# Patient Record
Sex: Male | Born: 1955 | Race: White | Hispanic: No | Marital: Married
Health system: Southern US, Community
[De-identification: ages and names within clinical notes are randomized; demographics above are authoritative.]

## PROBLEM LIST (undated history)

## (undated) ENCOUNTER — Emergency Department (HOSPITAL_COMMUNITY): Admission: EM | Payer: Self-pay

## (undated) DIAGNOSIS — E119 Type 2 diabetes mellitus without complications: Secondary | ICD-10-CM

---

## 2002-12-21 DIAGNOSIS — I219 Acute myocardial infarction, unspecified: Secondary | ICD-10-CM

## 2002-12-21 HISTORY — DX: Acute myocardial infarction, unspecified: I21.9

## 2019-07-03 DIAGNOSIS — E1142 Type 2 diabetes mellitus with diabetic polyneuropathy: Secondary | ICD-10-CM | POA: Diagnosis not present

## 2019-07-03 DIAGNOSIS — E785 Hyperlipidemia, unspecified: Secondary | ICD-10-CM | POA: Diagnosis not present

## 2019-10-05 DIAGNOSIS — G8929 Other chronic pain: Secondary | ICD-10-CM | POA: Diagnosis not present

## 2019-10-05 DIAGNOSIS — E1142 Type 2 diabetes mellitus with diabetic polyneuropathy: Secondary | ICD-10-CM | POA: Diagnosis not present

## 2019-11-20 ENCOUNTER — Other Ambulatory Visit: Payer: Self-pay

## 2019-11-20 NOTE — Patient Outreach (Signed)
[facility]) Care Management  11/20/2019  Terry Howell 1956-10-26 [id number]   Medication Adherence call to Mr. Terry Howell [facility] Identifiers Verify spoke with patient he is past due on Pravastatin 40 mg,patient explain he takes his medication on time,patient wife takes care of his medication,she ask not to call again,patient knows how to order when he needs then.Terry Howell is showing past due under [place].    [facility] Direct Dial [phone]  Fax [phone] [email or web address]

## 2020-01-08 DIAGNOSIS — E1142 Type 2 diabetes mellitus with diabetic polyneuropathy: Secondary | ICD-10-CM | POA: Diagnosis not present

## 2020-05-06 DIAGNOSIS — Z0000 Encounter for general adult medical examination without abnormal findings: Secondary | ICD-10-CM | POA: Diagnosis not present

## 2020-05-06 DIAGNOSIS — E1142 Type 2 diabetes mellitus with diabetic polyneuropathy: Secondary | ICD-10-CM | POA: Diagnosis not present

## 2020-09-06 ENCOUNTER — Inpatient Hospital Stay (HOSPITAL_COMMUNITY)
Admission: RE | Admit: 2020-09-06 | Discharge: 2020-09-20 | DRG: 023 | Disposition: E | Payer: Medicare Other | Source: Other Acute Inpatient Hospital | Attending: Neurology | Admitting: Neurology

## 2020-09-06 ENCOUNTER — Inpatient Hospital Stay (HOSPITAL_COMMUNITY): Payer: Medicare Other | Admitting: Certified Registered Nurse Anesthetist

## 2020-09-06 ENCOUNTER — Encounter (HOSPITAL_COMMUNITY): Payer: Self-pay | Admitting: Interventional Radiology

## 2020-09-06 ENCOUNTER — Inpatient Hospital Stay (HOSPITAL_COMMUNITY): Payer: Medicare Other

## 2020-09-06 ENCOUNTER — Encounter (HOSPITAL_COMMUNITY): Admission: AD | Disposition: E | Payer: Self-pay | Source: Other Acute Inpatient Hospital | Attending: Neurology

## 2020-09-06 DIAGNOSIS — Z0189 Encounter for other specified special examinations: Secondary | ICD-10-CM

## 2020-09-06 DIAGNOSIS — J9601 Acute respiratory failure with hypoxia: Secondary | ICD-10-CM | POA: Diagnosis present

## 2020-09-06 DIAGNOSIS — I429 Cardiomyopathy, unspecified: Secondary | ICD-10-CM | POA: Diagnosis present

## 2020-09-06 DIAGNOSIS — E1165 Type 2 diabetes mellitus with hyperglycemia: Secondary | ICD-10-CM | POA: Diagnosis present

## 2020-09-06 DIAGNOSIS — I5023 Acute on chronic systolic (congestive) heart failure: Secondary | ICD-10-CM | POA: Diagnosis present

## 2020-09-06 DIAGNOSIS — D72829 Elevated white blood cell count, unspecified: Secondary | ICD-10-CM | POA: Diagnosis present

## 2020-09-06 DIAGNOSIS — R29724 NIHSS score 24: Secondary | ICD-10-CM | POA: Diagnosis present

## 2020-09-06 DIAGNOSIS — I952 Hypotension due to drugs: Secondary | ICD-10-CM | POA: Diagnosis not present

## 2020-09-06 DIAGNOSIS — R0902 Hypoxemia: Secondary | ICD-10-CM

## 2020-09-06 DIAGNOSIS — J9600 Acute respiratory failure, unspecified whether with hypoxia or hypercapnia: Secondary | ICD-10-CM

## 2020-09-06 DIAGNOSIS — G8191 Hemiplegia, unspecified affecting right dominant side: Secondary | ICD-10-CM | POA: Diagnosis not present

## 2020-09-06 DIAGNOSIS — I255 Ischemic cardiomyopathy: Secondary | ICD-10-CM | POA: Diagnosis not present

## 2020-09-06 DIAGNOSIS — R29810 Facial weakness: Secondary | ICD-10-CM | POA: Diagnosis present

## 2020-09-06 DIAGNOSIS — D62 Acute posthemorrhagic anemia: Secondary | ICD-10-CM | POA: Diagnosis not present

## 2020-09-06 DIAGNOSIS — I2510 Atherosclerotic heart disease of native coronary artery without angina pectoris: Secondary | ICD-10-CM | POA: Diagnosis present

## 2020-09-06 DIAGNOSIS — G9389 Other specified disorders of brain: Secondary | ICD-10-CM | POA: Diagnosis present

## 2020-09-06 DIAGNOSIS — Z955 Presence of coronary angioplasty implant and graft: Secondary | ICD-10-CM

## 2020-09-06 DIAGNOSIS — E785 Hyperlipidemia, unspecified: Secondary | ICD-10-CM | POA: Diagnosis present

## 2020-09-06 DIAGNOSIS — Z7401 Bed confinement status: Secondary | ICD-10-CM

## 2020-09-06 DIAGNOSIS — IMO0002 Reserved for concepts with insufficient information to code with codable children: Secondary | ICD-10-CM | POA: Diagnosis present

## 2020-09-06 DIAGNOSIS — I63412 Cerebral infarction due to embolism of left middle cerebral artery: Secondary | ICD-10-CM | POA: Diagnosis present

## 2020-09-06 DIAGNOSIS — Z7189 Other specified counseling: Secondary | ICD-10-CM | POA: Diagnosis not present

## 2020-09-06 DIAGNOSIS — H53461 Homonymous bilateral field defects, right side: Secondary | ICD-10-CM | POA: Diagnosis present

## 2020-09-06 DIAGNOSIS — B999 Unspecified infectious disease: Secondary | ICD-10-CM

## 2020-09-06 DIAGNOSIS — Z515 Encounter for palliative care: Secondary | ICD-10-CM | POA: Diagnosis not present

## 2020-09-06 DIAGNOSIS — I358 Other nonrheumatic aortic valve disorders: Secondary | ICD-10-CM | POA: Diagnosis present

## 2020-09-06 DIAGNOSIS — I69391 Dysphagia following cerebral infarction: Secondary | ICD-10-CM

## 2020-09-06 DIAGNOSIS — R4701 Aphasia: Secondary | ICD-10-CM | POA: Diagnosis present

## 2020-09-06 DIAGNOSIS — I639 Cerebral infarction, unspecified: Secondary | ICD-10-CM

## 2020-09-06 DIAGNOSIS — J810 Acute pulmonary edema: Secondary | ICD-10-CM | POA: Diagnosis present

## 2020-09-06 DIAGNOSIS — I110 Hypertensive heart disease with heart failure: Secondary | ICD-10-CM | POA: Diagnosis present

## 2020-09-06 DIAGNOSIS — E876 Hypokalemia: Secondary | ICD-10-CM | POA: Diagnosis not present

## 2020-09-06 DIAGNOSIS — R509 Fever, unspecified: Secondary | ICD-10-CM | POA: Diagnosis present

## 2020-09-06 DIAGNOSIS — Z66 Do not resuscitate: Secondary | ICD-10-CM | POA: Diagnosis not present

## 2020-09-06 DIAGNOSIS — Z794 Long term (current) use of insulin: Secondary | ICD-10-CM | POA: Diagnosis not present

## 2020-09-06 DIAGNOSIS — I959 Hypotension, unspecified: Secondary | ICD-10-CM | POA: Diagnosis present

## 2020-09-06 DIAGNOSIS — I63512 Cerebral infarction due to unspecified occlusion or stenosis of left middle cerebral artery: Secondary | ICD-10-CM | POA: Diagnosis present

## 2020-09-06 DIAGNOSIS — I340 Nonrheumatic mitral (valve) insufficiency: Secondary | ICD-10-CM | POA: Diagnosis not present

## 2020-09-06 DIAGNOSIS — G9340 Encephalopathy, unspecified: Secondary | ICD-10-CM | POA: Diagnosis present

## 2020-09-06 DIAGNOSIS — R627 Adult failure to thrive: Secondary | ICD-10-CM | POA: Diagnosis present

## 2020-09-06 DIAGNOSIS — E7800 Pure hypercholesterolemia, unspecified: Secondary | ICD-10-CM | POA: Diagnosis not present

## 2020-09-06 DIAGNOSIS — I10 Essential (primary) hypertension: Secondary | ICD-10-CM | POA: Diagnosis present

## 2020-09-06 DIAGNOSIS — E870 Hyperosmolality and hypernatremia: Secondary | ICD-10-CM | POA: Diagnosis present

## 2020-09-06 DIAGNOSIS — I252 Old myocardial infarction: Secondary | ICD-10-CM

## 2020-09-06 DIAGNOSIS — R7881 Bacteremia: Secondary | ICD-10-CM | POA: Diagnosis not present

## 2020-09-06 DIAGNOSIS — R1310 Dysphagia, unspecified: Secondary | ICD-10-CM | POA: Diagnosis present

## 2020-09-06 DIAGNOSIS — I5043 Acute on chronic combined systolic (congestive) and diastolic (congestive) heart failure: Secondary | ICD-10-CM | POA: Diagnosis not present

## 2020-09-06 DIAGNOSIS — I509 Heart failure, unspecified: Secondary | ICD-10-CM

## 2020-09-06 DIAGNOSIS — I5021 Acute systolic (congestive) heart failure: Secondary | ICD-10-CM | POA: Diagnosis present

## 2020-09-06 DIAGNOSIS — R1312 Dysphagia, oropharyngeal phase: Secondary | ICD-10-CM | POA: Diagnosis not present

## 2020-09-06 DIAGNOSIS — I471 Supraventricular tachycardia: Secondary | ICD-10-CM | POA: Diagnosis not present

## 2020-09-06 DIAGNOSIS — I25119 Atherosclerotic heart disease of native coronary artery with unspecified angina pectoris: Secondary | ICD-10-CM | POA: Diagnosis not present

## 2020-09-06 DIAGNOSIS — Z20822 Contact with and (suspected) exposure to covid-19: Secondary | ICD-10-CM | POA: Diagnosis present

## 2020-09-06 DIAGNOSIS — E1159 Type 2 diabetes mellitus with other circulatory complications: Secondary | ICD-10-CM | POA: Diagnosis not present

## 2020-09-06 HISTORY — DX: Type 2 diabetes mellitus without complications: E11.9

## 2020-09-06 HISTORY — PX: IR CT HEAD LTD: IMG2386

## 2020-09-06 HISTORY — PX: RADIOLOGY WITH ANESTHESIA: SHX6223

## 2020-09-06 HISTORY — PX: IR PERCUTANEOUS ART THROMBECTOMY/INFUSION INTRACRANIAL INC DIAG ANGIO: IMG6087

## 2020-09-06 HISTORY — PX: IR US GUIDE VASC ACCESS RIGHT: IMG2390

## 2020-09-06 LAB — GLUCOSE, CAPILLARY: Glucose-Capillary: 141 mg/dL — ABNORMAL HIGH (ref 70–99)

## 2020-09-06 SURGERY — IR WITH ANESTHESIA
Anesthesia: General

## 2020-09-06 MED ORDER — ASPIRIN 81 MG PO CHEW
CHEWABLE_TABLET | ORAL | Status: AC
  Filled 2020-09-06: qty 1

## 2020-09-06 MED ORDER — LIDOCAINE HCL (CARDIAC) PF 100 MG/5ML IV SOSY
PREFILLED_SYRINGE | INTRAVENOUS | Status: DC | PRN
  Administered 2020-09-06: 40 mg via INTRAVENOUS

## 2020-09-06 MED ORDER — PROPOFOL 1000 MG/100ML IV EMUL
5.0000 ug/kg/min | INTRAVENOUS | Status: DC
  Administered 2020-09-07: 40 ug/kg/min via INTRAVENOUS
  Administered 2020-09-07: 25 ug/kg/min via INTRAVENOUS
  Administered 2020-09-07 (×2): 50 ug/kg/min via INTRAVENOUS
  Administered 2020-09-07: 40 ug/kg/min via INTRAVENOUS
  Administered 2020-09-08: 20 ug/kg/min via INTRAVENOUS
  Administered 2020-09-08: 40 ug/kg/min via INTRAVENOUS
  Administered 2020-09-08: 30 ug/kg/min via INTRAVENOUS
  Administered 2020-09-08: 40 ug/kg/min via INTRAVENOUS
  Administered 2020-09-08: 30 ug/kg/min via INTRAVENOUS
  Administered 2020-09-09: 10 ug/kg/min via INTRAVENOUS
  Administered 2020-09-09 (×2): 30 ug/kg/min via INTRAVENOUS
  Administered 2020-09-09: 20 ug/kg/min via INTRAVENOUS
  Administered 2020-09-10 (×2): 30 ug/kg/min via INTRAVENOUS
  Filled 2020-09-06 (×11): qty 100
  Filled 2020-09-06: qty 200
  Filled 2020-09-06 (×6): qty 100

## 2020-09-06 MED ORDER — STROKE: EARLY STAGES OF RECOVERY BOOK
Freq: Once | Status: DC
  Filled 2020-09-06 (×2): qty 1

## 2020-09-06 MED ORDER — TICAGRELOR 90 MG PO TABS
ORAL_TABLET | ORAL | Status: AC
  Filled 2020-09-06: qty 2

## 2020-09-06 MED ORDER — EPHEDRINE SULFATE 50 MG/ML IJ SOLN
INTRAMUSCULAR | Status: DC | PRN
  Administered 2020-09-06: 20 mg via INTRAVENOUS
  Administered 2020-09-06 (×2): 15 mg via INTRAVENOUS

## 2020-09-06 MED ORDER — ACETAMINOPHEN 160 MG/5ML PO SOLN
650.0000 mg | ORAL | Status: DC | PRN
  Administered 2020-09-08 – 2020-09-10 (×8): 650 mg
  Filled 2020-09-06 (×9): qty 20.3

## 2020-09-06 MED ORDER — ROCURONIUM BROMIDE 100 MG/10ML IV SOLN
INTRAVENOUS | Status: DC | PRN
  Administered 2020-09-06: 80 mg via INTRAVENOUS

## 2020-09-06 MED ORDER — PROPOFOL 1000 MG/100ML IV EMUL: 5.0000 ug/kg/min | INTRAVENOUS | Status: DC

## 2020-09-06 MED ORDER — CLOPIDOGREL BISULFATE 300 MG PO TABS
ORAL_TABLET | ORAL | Status: AC
  Filled 2020-09-06: qty 1

## 2020-09-06 MED ORDER — IOHEXOL 300 MG/ML  SOLN
50.0000 mL | Freq: Once | INTRAMUSCULAR | Status: AC | PRN
  Administered 2020-09-06: 20 mL via INTRA_ARTERIAL

## 2020-09-06 MED ORDER — SUCCINYLCHOLINE CHLORIDE 20 MG/ML IJ SOLN
INTRAMUSCULAR | Status: DC | PRN
  Administered 2020-09-06: 180 mg via INTRAVENOUS

## 2020-09-06 MED ORDER — VERAPAMIL HCL 2.5 MG/ML IV SOLN
INTRAVENOUS | Status: AC
  Filled 2020-09-06: qty 2

## 2020-09-06 MED ORDER — NITROGLYCERIN 1 MG/10 ML FOR IR/CATH LAB
INTRA_ARTERIAL | Status: AC
  Filled 2020-09-06: qty 10

## 2020-09-06 MED ORDER — PHENYLEPHRINE 40 MCG/ML (10ML) SYRINGE FOR IV PUSH (FOR BLOOD PRESSURE SUPPORT)
PREFILLED_SYRINGE | INTRAVENOUS | Status: DC | PRN
  Administered 2020-09-06: 120 ug via INTRAVENOUS
  Administered 2020-09-06: 160 ug via INTRAVENOUS
  Administered 2020-09-06: 120 ug via INTRAVENOUS

## 2020-09-06 MED ORDER — PANTOPRAZOLE SODIUM 40 MG IV SOLR
40.0000 mg | Freq: Every day | INTRAVENOUS | Status: DC
  Administered 2020-09-07 – 2020-09-10 (×4): 40 mg via INTRAVENOUS
  Filled 2020-09-06 (×4): qty 40

## 2020-09-06 MED ORDER — SODIUM CHLORIDE 0.9 % IV SOLN: INTRAVENOUS | Status: DC

## 2020-09-06 MED ORDER — TIROFIBAN HCL IN NACL 5-0.9 MG/100ML-% IV SOLN
INTRAVENOUS | Status: AC
  Filled 2020-09-06: qty 100

## 2020-09-06 MED ORDER — LACTATED RINGERS IV SOLN: INTRAVENOUS | Status: DC | PRN

## 2020-09-06 MED ORDER — SENNOSIDES-DOCUSATE SODIUM 8.6-50 MG PO TABS
1.0000 | ORAL_TABLET | Freq: Every evening | ORAL | Status: DC | PRN
  Filled 2020-09-06: qty 1

## 2020-09-06 MED ORDER — IOHEXOL 240 MG/ML SOLN
INTRAMUSCULAR | Status: AC
  Filled 2020-09-06: qty 200

## 2020-09-06 MED ORDER — PHENYLEPHRINE HCL-NACL 10-0.9 MG/250ML-% IV SOLN
INTRAVENOUS | Status: DC | PRN
  Administered 2020-09-06: 20 ug/min via INTRAVENOUS

## 2020-09-06 MED ORDER — ACETAMINOPHEN 325 MG PO TABS
650.0000 mg | ORAL_TABLET | ORAL | Status: DC | PRN
  Filled 2020-09-06 (×2): qty 2

## 2020-09-06 MED ORDER — PROPOFOL 10 MG/ML IV BOLUS
INTRAVENOUS | Status: DC | PRN
  Administered 2020-09-06: 110 mg via INTRAVENOUS

## 2020-09-06 MED ORDER — EPTIFIBATIDE 20 MG/10ML IV SOLN
INTRAVENOUS | Status: AC
  Filled 2020-09-06: qty 10

## 2020-09-06 MED ORDER — PHENYLEPHRINE HCL-NACL 10-0.9 MG/250ML-% IV SOLN
0.0000 ug/min | INTRAVENOUS | Status: DC
  Administered 2020-09-07: 5 ug/min via INTRAVENOUS
  Filled 2020-09-06 (×2): qty 250

## 2020-09-06 MED ORDER — IOHEXOL 300 MG/ML  SOLN
150.0000 mL | Freq: Once | INTRAMUSCULAR | Status: AC | PRN
  Administered 2020-09-06: 25 mL via INTRA_ARTERIAL

## 2020-09-06 MED ORDER — ACETAMINOPHEN 650 MG RE SUPP
650.0000 mg | RECTAL | Status: DC | PRN
  Administered 2020-09-07 – 2020-09-11 (×5): 650 mg via RECTAL
  Filled 2020-09-06 (×4): qty 1

## 2020-09-06 NOTE — Anesthesia Preprocedure Evaluation (Signed)
Anesthesia Evaluation  Patient identified by MRN, date of birth, ID band Patient unresponsive    Reviewed: Allergy & Precautions, Unable to perform ROS - Chart review onlyPreop documentation limited or incomplete due to emergent nature of procedure.  Airway Mallampati: III  TM Distance: >3 FB     Dental  (+) Edentulous Upper, Partial Lower   Pulmonary     + decreased breath sounds      Cardiovascular hypertension,  Rhythm:Regular Rate:Normal     Neuro/Psych CVA    GI/Hepatic   Endo/Other  diabetes  Renal/GU      Musculoskeletal   Abdominal (+) + obese,   Peds  (+) Neurological problem Hematology   Anesthesia Other Findings   Reproductive/Obstetrics                             Anesthesia Physical Anesthesia Plan  ASA: IV and emergent  Anesthesia Plan: General   Post-op Pain Management:    Induction: Intravenous  PONV Risk Score and Plan:   Airway Management Planned: Oral ETT  Additional Equipment: Arterial line  Intra-op Plan:   Post-operative Plan: Post-operative intubation/ventilation  Informed Consent: I have reviewed the patients History and Physical, chart, labs and discussed the procedure including the risks, benefits and alternatives for the proposed anesthesia with the patient or authorized representative who has indicated his/her understanding and acceptance.       Plan Discussed with: CRNA and Anesthesiologist  Anesthesia Plan Comments:         Anesthesia Quick Evaluation

## 2020-09-06 NOTE — Anesthesia Procedure Notes (Signed)
Arterial Line Insertion Start/End09/01/2020 9:48 AM, 09/06/2020 9:53 PM Performed by: Kipp Brood, MD, anesthesiologist  Emergency situation radial was placed Catheter size: 20 G Hand hygiene performed  and maximum sterile barriers used  Allen's test indicative of satisfactory collateral circulation Attempts: 1 Procedure performed using ultrasound guided technique. Following insertion, Biopatch and dressing applied. Post procedure assessment: normal  Patient tolerated the procedure well with no immediate complications.

## 2020-09-06 NOTE — Progress Notes (Signed)
NeuroInterventional Radiology  Pre-Procedure Note  History: 64 yo male witnessed (heard) by his wife to fall at about 4pm this afternoon.  He was worked up at [facility] for stroke, and discovered to have a left M1 occlusion. History is positive for CV risk factors to include known MI/CAD, diabetes, pulmonary disease.   He was given tPA and transferred to [facility].   Baseline mRS: 0 NIHSS:   24  CT ASPECTS: 10 CTA:   Left M1 occlusion   I have discussed the case with Dr. Laurence Slate of Stroke Neurology.  Given the patient's symptoms, imaging findings, baseline function, we agree they are an appropriate candidate for attempt for mechanical thrombectomy.    The risks and benefits of the procedure were discussed with the patient's wife,Terry Howell, with specific risks including: bleeding, infection, arterial injury/dissection, contrast reaction, kidney injury, need for further procedure/surgery, neurologic deficit, 10-15% risk of intracranial hemorrhage, cardiopulmonary collapse, death. All questions were answered.  The wife give consent and would like to proceed with attempt at thrombectomy.   Plan for cerebral angiogram and attempt at mechanical thrombectomy.   Signed,   Yvone Neu. Loreta Ave, DO

## 2020-09-06 NOTE — H&P (Signed)
Chief Complaint: Aphasia right-sided weakness  History obtained from: Patient and Chart HPI:                                                                                                                                       Rollin Kotowski. is a 64 y.o. male with past medical history significant for hypertension, hyperlipidemia, uncontrolled insulin-dependent diabetes mellitus, history of coronary artery disease status post PCI, heart failure presents to the emergency department [facility] with sudden onset aphasia and right-sided weakness that began at 4 PM.  Last seen normal was 4 PM-patient was out to take the trash and suddenly developed right-sided weakness and aphasia.  Patient was taken to [facility] ED as a code stroke.  Tele-neurologist was consulted and upon assessment NIH stroke scale was 24.  Stat CT head was performed which showed no hemorrhage and aspects was 10/10.  Patient received IV TPA with delayed due to wife's initial reluctance.  CT angiogram was performed which showed a left M1 occlusion.  Received a call from Dr. Thomasena Edis, ED PA [facility] around 8/15 PM.  Spoke to her over the phone and patient appeared to be a candidate for mechanical thrombectomy and requests immediate transfer to [facility] for emergent mechanical thrombectomy. Discussed with patient's wife over the phone who consented for thrombectomy procedure. Patient arrived around 9:30 PM to [facility] emergency room-was immediately taken for neuro intervention.  NIH stroke scale remained stable 24 prior to thrombectomy.  IV TPA was completed at 9:06 PM.  Blood pressure was 112 systolic    Date last known well: 09-18-20 Time last known well: 4 PM tPA Given: Yes NIHSS: 24 Baseline MRS 0  Past medical history -As stated above  History reviewed. No pertinent family history. Social History:  has no history on file for tobacco use, alcohol use, and drug use.  Allergies: Not on  File  Medications:                                                                                                                        I reviewed home medications   ROS:  Unable to review due to patient's mental status   Examination:                                                                                                      General: Appears well-developed . Psych: Affect appropriate to situation Eyes: No scleral injection HENT: No OP obstrucion Head: Normocephalic.  Cardiovascular: Normal rate and regular rhythm.  Respiratory: Effort normal and breath sounds normal to anterior ascultation GI: Soft.  No distension. There is no tenderness.  Skin: WDI    Neurological Examination Mental Status: Awake, nonverbal and not following commands consistently Cranial Nerves: II: Visual fields: Right homonymous hemianopsia  III,IV, VI: ptosis not present, left forced gaze deviation, pupils equal, round, reactive to light and accommodation V,VII: Right facial droop, unable to assess facial sensation XII: midline tongue extension Motor: Right : Upper extremity   0/5    Left:     Upper extremity   5/5  Lower extremity   0//5     Lower extremity   5/5 Tone and bulk:normal tone throughout; no atrophy noted Sensory: Neglect and does not withdraw to noxious stimulus on the right side Deep Tendon Reflexes: 2+ and symmetric throughout Plantars: Right: downgoing   Left: downgoing Cerebellar: No gross ataxia noted on the left side      Lab Results: Basic Metabolic Panel: No results for input(s): NA, K, CL, CO2, GLUCOSE, BUN, CREATININE, CALCIUM, MG, PHOS in the last 168 hours.  CBC: No results for input(s): WBC, NEUTROABS, HGB, HCT, MCV, PLT in the last 168 hours.  Coagulation Studies: No results for input(s): LABPROT, INR in the last 72  hours.  Imaging: No results found.   ASSESSMENT AND PLAN  64 y.o. 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  Patient received IV TPA and is a candidate for mechanical thrombectomy to presented within 6 hours last known normal.  Spoke to wife in detail regarding benefits versus risk of mechanical thrombectomy including 30 to 50% chance of good functional recovery versus 10% risk of major intracranial hemorrhage.  Without intervention patient will likely most likely be severely disabled and bedbound if he survives.  Patient also has heart failure and uncontrolled diabetes-which increases chance of early progression and completion of stroke.  Patient's wife consented to the procedure understanding these risks.  Acute left MCA stroke status post IV TPA and emergent mechanical thrombectomy with TICI 3 recanalization   #Admit to neuro ICU # MRI of the brain without contrast #Echocardiogram #No antiplatelets until 24 hours after repeat CT head negative for hemorrhage #Start or continue Atorvastatin 80 mg/other high intensity statin # BP goal: 1 20-1 40 systolic # HBAIC and Lipid profile # Telemetry monitoring # Frequent neuro checks #  stroke swallow screen  #Hypertension -BP goal 1 20-140 systolic, as needed Cleviprex available  #Acute respiratory failure  Appreciate PCCM assistance  #History of congestive heart failure -Management by PCCM -Avoid excessive fluids  #Insulin-dependent diabetes mellitus -Sliding scale insulin -A1c ordered    This patient is neurologically critically ill due to stroke requiring TPA and thrombectomy he is at risk for significant risk of  neurological worsening from cerebral edema,  death from brain herniation, heart failure, hemorrhagic conversion, infection, respiratory failure and seizure. This patient's care requires constant monitoring of vital signs, hemodynamics, respiratory and cardiac monitoring, review of multiple databases, neurological assessment, discussion with family, other specialists and medical decision making of high complexity.  I spent 65 minutes of neurocritical time in the care of this patient.     Please page stroke NP  Or  PA  Or MD from 8am -4 pm  as this patient from this time will be  followed by the stroke.   You can look them up on [email or web address]  Password [facility]    Hy Swiatek [facility] Pager Number [phone]

## 2020-09-06 NOTE — Anesthesia Procedure Notes (Addendum)
Procedure Name: Intubation Date/Time: 2020/09/10 9:42 PM Performed by: Drema Pry, CRNA Pre-anesthesia Checklist: Patient identified, Emergency Drugs available, Suction available and Patient being monitored Patient Re-evaluated:Patient Re-evaluated prior to induction Oxygen Delivery Method: Circle System Utilized Preoxygenation: Pre-oxygenation with 100% oxygen Induction Type: IV induction, Rapid sequence and Cricoid Pressure applied Laryngoscope Size: Glidescope and 4 Grade View: Grade I Tube type: Oral Tube size: 7.5 mm Number of attempts: 1 Airway Equipment and Method: Stylet Placement Confirmation: ETT inserted through vocal cords under direct vision,  positive ETCO2 and breath sounds checked- equal and bilateral Secured at: 22 cm Tube secured with: Tape Dental Injury: Teeth and Oropharynx as per pre-operative assessment

## 2020-09-06 NOTE — Code Documentation (Signed)
Responded to Code IR paged out at 2033 for L M1 occlusion, aphasia, R sided deficits. Pt received TPA at [facility]. Pt arrived at 2125 and transported to [employer] [facility] at 2128. Per [facility], TPA finished at 2106. [facility] then began TPA flush. Pt intubated by anesthesia and transported to IR suite.

## 2020-09-06 NOTE — Transfer of Care (Signed)
Immediate Anesthesia Transfer of Care Note  Patient: Terry Howell.  Procedure(s) Performed: IR WITH ANESTHESIA (N/A )  Patient Location: PACU  Anesthesia Type:General  Level of Consciousness: sedated and Patient remains intubated per anesthesia plan  Airway & Oxygen Therapy: Patient remains intubated per anesthesia plan and Patient placed on Ventilator (see vital sign flow sheet for setting)  Post-op Assessment: Report given to RN and Post -op Vital signs reviewed and stable  Post vital signs: Reviewed and stable  Last Vitals:  Vitals Value Taken Time  BP 116/77 21-Sep-2020 2310  Temp    Pulse 93 Sep 21, 2020 2319  Resp 14 Sep 21, 2020 2319  SpO2 100 % 2020-09-21 2319  Vitals shown include unvalidated device data.  Last Pain: There were no vitals filed for this visit.       Complications: No complications documented.

## 2020-09-06 NOTE — Procedures (Signed)
Neuro-Interventional Radiology  Post Cerebral Angiogram Procedure Note  Operator:    Dr. Loreta Ave Assistant:   None  History: 64 yo male witnessed (heard) by his wife to fall at about 4pm this afternoon.  He was worked up at [facility] for stroke, and discovered to have a left M1 occlusion. History is positive for CV risk factors to include known MI/CAD, diabetes, pulmonary disease.  Baseline mRS:  0 NIHSS:   24 Site of occlusion:  Left m1   Procedure: US guided right CFA access Cervical & Cerebral Angiogram Mechanical Thrombectomy Deployment of Angioseal Flat Panel CT in NIR  Baseline: TICI 0  First Pass Device:  Direct aspiration, zoom 71  Result   TICI 3  Flat panel findings:   No hemorrhage  Anesthesia:   GETA  EBL:    60cc     Complication:  None   Medication: IV tPA administered?: yes IA Medication:  no  Recommendations: - right hip straight overnight - Goal SBP 120-140.   - Frequent NV checks - Repeat CT or MRI imaging recommended within 36 hours, discretion of Neurology - NIR to follow    Signed,  Yvone Neu. Loreta Ave, DO

## 2020-09-07 ENCOUNTER — Inpatient Hospital Stay (HOSPITAL_COMMUNITY): Payer: Medicare Other

## 2020-09-07 ENCOUNTER — Encounter (HOSPITAL_COMMUNITY): Payer: Self-pay | Admitting: Neurology

## 2020-09-07 ENCOUNTER — Other Ambulatory Visit: Payer: Self-pay

## 2020-09-07 DIAGNOSIS — I639 Cerebral infarction, unspecified: Secondary | ICD-10-CM

## 2020-09-07 DIAGNOSIS — E7800 Pure hypercholesterolemia, unspecified: Secondary | ICD-10-CM

## 2020-09-07 DIAGNOSIS — I952 Hypotension due to drugs: Secondary | ICD-10-CM

## 2020-09-07 DIAGNOSIS — I63512 Cerebral infarction due to unspecified occlusion or stenosis of left middle cerebral artery: Secondary | ICD-10-CM

## 2020-09-07 DIAGNOSIS — J9601 Acute respiratory failure with hypoxia: Secondary | ICD-10-CM

## 2020-09-07 DIAGNOSIS — D72829 Elevated white blood cell count, unspecified: Secondary | ICD-10-CM

## 2020-09-07 DIAGNOSIS — J9600 Acute respiratory failure, unspecified whether with hypoxia or hypercapnia: Secondary | ICD-10-CM

## 2020-09-07 DIAGNOSIS — I255 Ischemic cardiomyopathy: Secondary | ICD-10-CM

## 2020-09-07 DIAGNOSIS — I340 Nonrheumatic mitral (valve) insufficiency: Secondary | ICD-10-CM

## 2020-09-07 LAB — LIPID PANEL
Cholesterol: 97 mg/dL (ref 0–200)
HDL: 30 mg/dL — ABNORMAL LOW (ref >40)
LDL Cholesterol: 50 mg/dL (ref 0–99)
Total CHOL/HDL Ratio: 3.2 RATIO
Triglycerides: 86 mg/dL (ref <150)
VLDL: 17 mg/dL (ref 0–40)

## 2020-09-07 LAB — MRSA PCR SCREENING: MRSA by PCR: NEGATIVE

## 2020-09-07 LAB — BASIC METABOLIC PANEL
Anion gap: 12 (ref 5–15)
BUN: 34 mg/dL — ABNORMAL HIGH (ref 8–23)
CO2: 22 mmol/L (ref 22–32)
Calcium: 8.4 mg/dL — ABNORMAL LOW (ref 8.9–10.3)
Chloride: 107 mmol/L (ref 98–111)
Creatinine, Ser: 1.11 mg/dL (ref 0.61–1.24)
GFR calc Af Amer: >60 mL/min (ref >60)
GFR calc non Af Amer: >60 mL/min (ref >60)
Glucose, Bld: 162 mg/dL — ABNORMAL HIGH (ref 70–99)
Potassium: 3.7 mmol/L (ref 3.5–5.1)
Sodium: 141 mmol/L (ref 135–145)

## 2020-09-07 LAB — POCT I-STAT 7, (LYTES, BLD GAS, ICA,H+H)
Acid-base deficit: 1 mmol/L (ref 0.0–2.0)
Bicarbonate: 23.5 mmol/L (ref 20.0–28.0)
Calcium, Ion: 1.13 mmol/L — ABNORMAL LOW (ref 1.15–1.40)
HCT: 30 % — ABNORMAL LOW (ref 39.0–52.0)
Hemoglobin: 10.2 g/dL — ABNORMAL LOW (ref 13.0–17.0)
O2 Saturation: 100 %
Patient temperature: 98.4
Potassium: 3.7 mmol/L (ref 3.5–5.1)
Sodium: 142 mmol/L (ref 135–145)
TCO2: 25 mmol/L (ref 22–32)
pCO2 arterial: 35.5 mmHg (ref 32.0–48.0)
pH, Arterial: 7.428 (ref 7.350–7.450)
pO2, Arterial: 306 mmHg — ABNORMAL HIGH (ref 83.0–108.0)

## 2020-09-07 LAB — ECHOCARDIOGRAM COMPLETE
Area-P 1/2: 5.97 cm2
MV M vel: 3.46 m/s
MV Peak grad: 47.9 mmHg
S' Lateral: 4.2 cm
Weight: 4409.2 oz

## 2020-09-07 LAB — GLUCOSE, CAPILLARY
Glucose-Capillary: 157 mg/dL — ABNORMAL HIGH (ref 70–99)
Glucose-Capillary: 153 mg/dL — ABNORMAL HIGH (ref 70–99)
Glucose-Capillary: 190 mg/dL — ABNORMAL HIGH (ref 70–99)
Glucose-Capillary: 174 mg/dL — ABNORMAL HIGH (ref 70–99)
Glucose-Capillary: 161 mg/dL — ABNORMAL HIGH (ref 70–99)
Glucose-Capillary: 196 mg/dL — ABNORMAL HIGH (ref 70–99)

## 2020-09-07 LAB — CBC
HCT: 33.5 % — ABNORMAL LOW (ref 39.0–52.0)
Hemoglobin: 10.5 g/dL — ABNORMAL LOW (ref 13.0–17.0)
MCH: 31.6 pg (ref 26.0–34.0)
MCHC: 31.3 g/dL (ref 30.0–36.0)
MCV: 100.9 fL — ABNORMAL HIGH (ref 80.0–100.0)
Platelets: 197 10*3/uL (ref 150–400)
RBC: 3.32 MIL/uL — ABNORMAL LOW (ref 4.22–5.81)
RDW: 14.7 % (ref 11.5–15.5)
WBC: 19.4 10*3/uL — ABNORMAL HIGH (ref 4.0–10.5)
nRBC: 0 % (ref 0.0–0.2)

## 2020-09-07 LAB — RAPID URINE DRUG SCREEN, HOSP PERFORMED
Amphetamines: NOT DETECTED
Barbiturates: NOT DETECTED
Benzodiazepines: NOT DETECTED
Cocaine: NOT DETECTED
Opiates: NOT DETECTED
Tetrahydrocannabinol: NOT DETECTED

## 2020-09-07 LAB — HIV ANTIBODY (ROUTINE TESTING W REFLEX): HIV Screen 4th Generation wRfx: NONREACTIVE

## 2020-09-07 LAB — HEMOGLOBIN A1C
Hgb A1c MFr Bld: 8.7 % — ABNORMAL HIGH (ref 4.8–5.6)
Mean Plasma Glucose: 202.99 mg/dL

## 2020-09-07 LAB — PHOSPHORUS: Phosphorus: 3.6 mg/dL (ref 2.5–4.6)

## 2020-09-07 LAB — MAGNESIUM: Magnesium: 1.9 mg/dL (ref 1.7–2.4)

## 2020-09-07 MED ORDER — FUROSEMIDE 10 MG/ML IJ SOLN
INTRAMUSCULAR | Status: AC
  Filled 2020-09-07: qty 4

## 2020-09-07 MED ORDER — FUROSEMIDE 10 MG/ML IJ SOLN
20.0000 mg | Freq: Two times a day (BID) | INTRAMUSCULAR | Status: DC
  Administered 2020-09-07 – 2020-09-08 (×3): 20 mg via INTRAVENOUS
  Filled 2020-09-07 (×3): qty 2

## 2020-09-07 MED ORDER — PNEUMOCOCCAL VAC POLYVALENT 25 MCG/0.5ML IJ INJ: 0.5000 mL | INJECTION | INTRAMUSCULAR | Status: DC

## 2020-09-07 MED ORDER — INSULIN ASPART 100 UNIT/ML ~~LOC~~ SOLN
0.0000 [IU] | SUBCUTANEOUS | Status: DC
  Administered 2020-09-07 (×4): 3 [IU] via SUBCUTANEOUS
  Administered 2020-09-08: 5 [IU] via SUBCUTANEOUS
  Administered 2020-09-08: 8 [IU] via SUBCUTANEOUS
  Administered 2020-09-08: 5 [IU] via SUBCUTANEOUS
  Administered 2020-09-08: 11 [IU] via SUBCUTANEOUS
  Administered 2020-09-08: 3 [IU] via SUBCUTANEOUS
  Administered 2020-09-09 (×3): 15 [IU] via SUBCUTANEOUS
  Administered 2020-09-09: 11 [IU] via SUBCUTANEOUS

## 2020-09-07 MED ORDER — INFLUENZA VAC SPLIT QUAD 0.5 ML IM SUSY: 0.5000 mL | PREFILLED_SYRINGE | INTRAMUSCULAR | Status: DC

## 2020-09-07 MED ORDER — PERFLUTREN LIPID MICROSPHERE
1.0000 mL | INTRAVENOUS | Status: AC | PRN
  Filled 2020-09-07: qty 10

## 2020-09-07 MED ORDER — PERFLUTREN LIPID MICROSPHERE
INTRAVENOUS | Status: AC
  Administered 2020-09-07: 1 mL
  Filled 2020-09-07: qty 10

## 2020-09-07 MED ORDER — CHLORHEXIDINE GLUCONATE 0.12% ORAL RINSE (MEDLINE KIT)
15.0000 mL | Freq: Two times a day (BID) | OROMUCOSAL | Status: DC
  Administered 2020-09-07 – 2020-09-10 (×7): 15 mL via OROMUCOSAL

## 2020-09-07 MED ORDER — CHLORHEXIDINE GLUCONATE CLOTH 2 % EX PADS
6.0000 | MEDICATED_PAD | Freq: Every day | CUTANEOUS | Status: DC
  Administered 2020-09-07 – 2020-09-10 (×4): 6 via TOPICAL

## 2020-09-07 MED ORDER — ORAL CARE MOUTH RINSE
15.0000 mL | OROMUCOSAL | Status: DC
  Administered 2020-09-07 – 2020-09-10 (×37): 15 mL via OROMUCOSAL

## 2020-09-07 NOTE — Plan of Care (Signed)
  Problem: Clinical Measurements: Goal: Will remain free from infection Outcome: Progressing Goal: Diagnostic test results will improve Outcome: Progressing Goal: Respiratory complications will improve Outcome: Progressing   Problem: Coping: Goal: Level of anxiety will decrease Outcome: Progressing   Problem: Pain Managment: Goal: General experience of comfort will improve Outcome: Progressing   Problem: Safety: Goal: Ability to remain free from injury will improve Outcome: Progressing   Problem: Skin Integrity: Goal: Risk for impaired skin integrity will decrease Outcome: Progressing

## 2020-09-07 NOTE — Discharge Instructions (Signed)
Femoral Site Care This sheet gives you information about how to care for yourself after your procedure. Your health care provider may also give you more specific instructions. If you have problems or questions, contact your health care provider. What can I expect after the procedure? After the procedure, it is common to have:  Bruising that usually fades within 1-2 weeks.  Tenderness at the site. Follow these instructions at home: Wound care 1. Follow instructions from your health care provider about how to take care of your insertion site. Make sure you: ? Wash your hands with soap and water before you change your bandage (dressing). If soap and water are not available, use hand sanitizer. ? Change your dressing as directed- pressure dressing removed 24 hours post-procedure (and switch for bandaid), bandaid removed 72 hours post-procedure 2. Do not take baths, swim, or use a hot tub for 7 days post-procedure. 3. You may shower 48 hours after the procedure or as told by your health care provider. ? Gently wash the site with plain soap and water. ? Pat the area dry with a clean towel. ? Do not rub the site. This may cause bleeding. 4. Check your site every day for signs of infection. Check for: ? Redness, swelling, or pain. ? Fluid or blood. ? Warmth. ? Pus or a bad smell. Activity  Do not stoop, bend, or lift anything that is heavier than 10 lb (4.5 kg) for 2 weeks post-procedure.  Do not drive self for 2 weeks post-procedure. Contact a health care provider if you have:  A fever or chills.  You have redness, swelling, or pain around your insertion site. Get help right away if:  The catheter insertion area swells very fast.  You pass out.  You suddenly start to sweat or your skin gets clammy.  The catheter insertion area is bleeding, and the bleeding does not stop when you hold steady pressure on the area.  The area near or just beyond the catheter insertion site becomes  pale, cool, tingly, or numb. These symptoms may represent a serious problem that is an emergency. Do not wait to see if the symptoms will go away. Get medical help right away. Call your local emergency services (911 in the [place] Do not drive yourself to the hospital.  This information is not intended to replace advice given to you by your health care provider. Make sure you discuss any questions you have with your health care provider. Document Revised: 12/20/2017 Document Reviewed: 12/20/2017 Elsevier Patient Education  2020 [employer] 

## 2020-09-07 NOTE — Progress Notes (Signed)
SLP Cancellation Note  Patient Details Name: Terry Howell. MRN: [id number] DOB: 04/09/1956   Cancelled treatment:       Reason Eval/Treat Not Completed: Medical issues which prohibited therapy; pt remains intubated; ST will continue efforts.   Tressie Stalker, M.S., CCC-SLP 09/07/2020, 8:21 AM

## 2020-09-07 NOTE — Progress Notes (Signed)
RT transported patient from 4N30 to MRI and back with RN. No complications. RT will continue to monitor.  

## 2020-09-07 NOTE — Progress Notes (Signed)
eLink Physician-Brief Progress Note Patient Name: Terry Howell. DOB: [birth year] MRN: [id number]   Date of Service  09/07/2020  HPI/Events of Note  Patient transferred to [facility] from [facility] after TPA for left MCA M 1 syndrome, at [facility] he underwent thrombectomy with successful revascularization, however he was admitted to the ICU with post-operative respiratory failure requiring mechanical ventilation.  eICU Interventions  New Patient Evaluation completed. Anticipate extubation this morning.        Thomasene Lot Naliyah Neth 09/07/2020, 3:04 AM

## 2020-09-07 NOTE — Progress Notes (Addendum)
STROKE TEAM PROGRESS NOTE   INTERVAL HISTORY His RN is at the bedside.  Pt is still intubated but eyes open, left gaze preference but able to have right gaze. However, not following commands. Still has right hemiplegia. Had IR overnight and pending MRI and MRA. Off Neo for now. Still on propofol. Waiting for the result of COVID test from [facility].   OBJECTIVE Vitals:   09/07/20 0645 09/07/20 0747 09/07/20 0800 09/07/20 0900  BP:  103/61 (!) 103/48 (!) 95/54  Pulse: 76 97 93 (!) 101  Resp: 18 (!) 28 (!) 27 (!) 30  Temp: 98.8 F (37.1 C) 99 F (37.2 C) 99 F (37.2 C) 99.3 F (37.4 C)  TempSrc:   Bladder   SpO2: 96% 98% 100% 100%  Weight:        CBC:  Recent Labs  Lab 09/07/20 0336  HGB 10.2*  HCT 30.0*    Basic Metabolic Panel:  Recent Labs  Lab 09/07/20 0336  NA 142  K 3.7    Lipid Panel: No results found for: CHOL, TRIG, HDL, CHOLHDL, VLDL, LDLCALC HgbA1c: No results found for: HGBA1C Urine Drug Screen: No results found for: LABOPIA, COCAINSCRNUR, LABBENZ, AMPHETMU, THCU, LABBARB  Alcohol Level No results found for: ETH  IMAGING  IR CT Head Ltd  Result Date: 09/07/2020 INDICATION: 63-year-ol male presents for mechanical thrombectomy of left emergent large vessel occlusion involving the left M1 EXAM: ULTRASOUND-GUIDED ACCESS RIGHT COMMON FEMORAL ARTERY CERVICAL AND CEREBRAL ANGIOGRAM MECHANICAL THROMBECTOMY LEFT MCA ANGIO-SEAL FOR HEMOSTASIS COMPARISON:  CT imaging same day MEDICATIONS: None ANESTHESIA/SEDATION: The anesthesia team was present to provide general endotracheal tube anesthesia and for patient monitoring during the procedure. Intubation was performed in negative pressure Bay in neuro IR holding. Left radial arterial line was performed by the anesthesia team. Interventional neuro radiology nursing staff was also present. CONTRAST:  60 cc contrast FLUOROSCOPY TIME:  Fluoroscopy Time: 8 minutes 48 seconds (778 mGy). COMPLICATIONS: None TECHNIQUE:  Informed written consent was obtained from the patient's family after a thorough discussion of the procedural risks, benefits and alternatives. Specific risks discussed include: Bleeding, infection, contrast reaction, kidney injury/failure, need for further procedure/surgery, arterial injury or dissection, embolization to new territory, intracranial hemorrhage (10-15% risk), neurologic deterioration, cardiopulmonary collapse, death. All questions were addressed. Maximal Sterile Barrier Technique was utilized including during the procedure including caps, mask, sterile gowns, sterile gloves, sterile drape, hand hygiene and skin antiseptic. A timeout was performed prior to the initiation of the procedure. The anesthesia team was present to provide general endotracheal tube anesthesia and for patient monitoring during the procedure. Interventional neuro radiology nursing staff was also present. FINDINGS: Initial Findings: Left common carotid artery:  Normal course caliber and contour. Left external carotid artery: Patent with antegrade flow. Left internal carotid artery: Normal course caliber and contour of the cervical portion. Vertical and petrous segment patent with normal course caliber contour. Cavernous segment patent. Clinoid segment patent. Antegrade flow of the ophthalmic artery. Ophthalmic segment patent. Terminus patent. Left MCA: No significant atherosclerotic changes are narrowing of the left MCA. There is a fairly early division/origin of the MCA, with an upgoing branch just beyond the lenticulostriate arteries, superior division. There is a orbital frontal or frontal polar branch in a downgoing configuration. The dominant branch is the inferior division, which is apparently the dominant division given its supply to the parietal territory after reperfusion. Of note, there is an M4 occlusion of the early superior branch, which remains occluded throughout. Left ACA:  A 1 segment patent. A 2 segment perfuses  the right territory. Significant p.o. collaterals are identified, attempting to reperfuse the parietal territory and the temporal territory from the ACA distribution in the watershed area. Completion Findings: Left MCA: Complete reperfusion of the dominant inferior division of the M1 with no embolization new territory. Flat panel CT: No hemorrhage. There is stagnant focus of contrast associated with the M4 occlusion, within a separate arterial territory than the treated M1 segment. PROCEDURE: The anesthesia team was present to provide general endotracheal tube anesthesia and for patient monitoring during the procedure. Intubation was performed in negative pressure Bay in neuro IR holding. Interventional neuro radiology nursing staff was also present. Ultrasound survey of the right inguinal region was performed with images stored and sent to PACs. 11 blade scalpel was used to make a small incision. Blunt dissection was performed with US guidance. A micropuncture needle was used access the right common femoral artery under ultrasound. With excellent arterial blood flow returned, an .018 micro wire was passed through the needle, observed to enter the abdominal aorta under fluoroscopy. The needle was removed, and a micropuncture sheath was placed over the wire. The inner dilator and wire were removed, and an 035 wire was advanced under fluoroscopy into the abdominal aorta. The sheath was removed and a 25cm 2F straight vascular sheath was placed. The dilator was removed and the sheath was flushed. Sheath was attached to pressurized and heparinized saline bag for constant forward flow. A coaxial system was then advanced over the 035 wire. This included a 95cm 087 "Walrus" balloon guide with coaxial 125cm Berenstein diagnostic catheter. This was advanced to the proximal descending thoracic aorta. Wire was then removed. Double flush of the catheter was performed. Catheter was then used to select the left common carotid  artery. Angiogram was performed. Using roadmap technique, the catheter was advanced over a standard glide wire into left cervical ICA, with distal position achieved of the balloon guide. The diagnostic catheter and the wire were removed. Formal angiogram was performed. Road map function was used once the occluded vessel was identified. Copious back flush was performed and the balloon catheter was attached to heparinized and pressurized saline bag for forward flow. A second coaxial system was then advanced through the balloon catheter, which included the selected intermediate catheter, microcatheter, and microwire. In this scenario, the set up included a 137cm zoom 71 intermediate catheter, a Trevo Pro18 microcatheter, and 014 synchro soft wire. This system was advanced through the balloon guide catheter under the road-map function, with adequate back-flush at the rotating hemostatic valve at that back end of the balloon guide. Microcatheter and the intermediate catheter system were advanced through the terminal ICA and MCA to the level of the occlusion. The micro wire was then carefully advanced up to the occluded segment. Intermediate catheter was then advanced on the microcatheter and wire combination, up to the proximal face of the thrombus. The microcatheter and microwire were then gently removed and the rotating hemostatic valve was removed from the intermediate catheter. Direct aspiration was then performed through the zoom catheter. Zoom catheter was then gently withdrawn with full aspiration, while manipulating the balloon guide more proximally within the cervical artery. Zoom catheter was slowly withdrawn from the balloon guide. Angiogram was performed, confirming complete restoration of flow. Angiogram of the cervical ICA was performed. Balloon guide was then removed. The skin at the puncture site was then cleaned with Chlorhexidine. The 8 French sheath was removed and an 2F angioseal  was deployed. Flat  panel CT was performed. Patient was extubated once the CT was reviewed. Patient tolerated the procedure well and remained hemodynamically stable throughout. No complications were encountered and no significant blood loss encountered. IMPRESSION: Status post ultrasound guided access right common femoral artery for left-sided cervical/cerebral angiogram and mechanical thrombectomy of left distal M1 occlusion using direct aspiration technique and achieving complete reperfusion of the occluded vessel. Angio-Seal for hemostasis. PLAN: The patient will remain intubated, given his comorbidities ICU status Target systolic blood pressure of 120-140 Right hip straight time 6 hours Frequent neurovascular checks Repeat neurologic imaging with CT and/MRI at the discretion of neurology team Electronically Signed   By: Gilmer Mor D.O.   On: 09/07/2020 10:18   IR US Guide Vasc Access Right  Result Date: 09/07/2020 INDICATION: 63-year-ol male presents for mechanical thrombectomy of left emergent large vessel occlusion involving the left M1 EXAM: ULTRASOUND-GUIDED ACCESS RIGHT COMMON FEMORAL ARTERY CERVICAL AND CEREBRAL ANGIOGRAM MECHANICAL THROMBECTOMY LEFT MCA ANGIO-SEAL FOR HEMOSTASIS COMPARISON:  CT imaging same day MEDICATIONS: None ANESTHESIA/SEDATION: The anesthesia team was present to provide general endotracheal tube anesthesia and for patient monitoring during the procedure. Intubation was performed in negative pressure Bay in neuro IR holding. Left radial arterial line was performed by the anesthesia team. Interventional neuro radiology nursing staff was also present. CONTRAST:  60 cc contrast FLUOROSCOPY TIME:  Fluoroscopy Time: 8 minutes 48 seconds (778 mGy). COMPLICATIONS: None TECHNIQUE: Informed written consent was obtained from the patient's family after a thorough discussion of the procedural risks, benefits and alternatives. Specific risks discussed include: Bleeding, infection, contrast reaction, kidney  injury/failure, need for further procedure/surgery, arterial injury or dissection, embolization to new territory, intracranial hemorrhage (10-15% risk), neurologic deterioration, cardiopulmonary collapse, death. All questions were addressed. Maximal Sterile Barrier Technique was utilized including during the procedure including caps, mask, sterile gowns, sterile gloves, sterile drape, hand hygiene and skin antiseptic. A timeout was performed prior to the initiation of the procedure. The anesthesia team was present to provide general endotracheal tube anesthesia and for patient monitoring during the procedure. Interventional neuro radiology nursing staff was also present. FINDINGS: Initial Findings: Left common carotid artery:  Normal course caliber and contour. Left external carotid artery: Patent with antegrade flow. Left internal carotid artery: Normal course caliber and contour of the cervical portion. Vertical and petrous segment patent with normal course caliber contour. Cavernous segment patent. Clinoid segment patent. Antegrade flow of the ophthalmic artery. Ophthalmic segment patent. Terminus patent. Left MCA: No significant atherosclerotic changes are narrowing of the left MCA. There is a fairly early division/origin of the MCA, with an upgoing branch just beyond the lenticulostriate arteries, superior division. There is a orbital frontal or frontal polar branch in a downgoing configuration. The dominant branch is the inferior division, which is apparently the dominant division given its supply to the parietal territory after reperfusion. Of note, there is an M4 occlusion of the early superior branch, which remains occluded throughout. Left ACA: A 1 segment patent. A 2 segment perfuses the right territory. Significant p.o. collaterals are identified, attempting to reperfuse the parietal territory and the temporal territory from the ACA distribution in the watershed area. Completion Findings: Left MCA:  Complete reperfusion of the dominant inferior division of the M1 with no embolization new territory. Flat panel CT: No hemorrhage. There is stagnant focus of contrast associated with the M4 occlusion, within a separate arterial territory than the treated M1 segment. PROCEDURE: The anesthesia team was present to provide general endotracheal  tube anesthesia and for patient monitoring during the procedure. Intubation was performed in negative pressure Bay in neuro IR holding. Interventional neuro radiology nursing staff was also present. Ultrasound survey of the right inguinal region was performed with images stored and sent to PACs. 11 blade scalpel was used to make a small incision. Blunt dissection was performed with US guidance. A micropuncture needle was used access the right common femoral artery under ultrasound. With excellent arterial blood flow returned, an .018 micro wire was passed through the needle, observed to enter the abdominal aorta under fluoroscopy. The needle was removed, and a micropuncture sheath was placed over the wire. The inner dilator and wire were removed, and an 035 wire was advanced under fluoroscopy into the abdominal aorta. The sheath was removed and a 25cm 49F straight vascular sheath was placed. The dilator was removed and the sheath was flushed. Sheath was attached to pressurized and heparinized saline bag for constant forward flow. A coaxial system was then advanced over the 035 wire. This included a 95cm 087 "Walrus" balloon guide with coaxial 125cm Berenstein diagnostic catheter. This was advanced to the proximal descending thoracic aorta. Wire was then removed. Double flush of the catheter was performed. Catheter was then used to select the left common carotid artery. Angiogram was performed. Using roadmap technique, the catheter was advanced over a standard glide wire into left cervical ICA, with distal position achieved of the balloon guide. The diagnostic catheter and the wire  were removed. Formal angiogram was performed. Road map function was used once the occluded vessel was identified. Copious back flush was performed and the balloon catheter was attached to heparinized and pressurized saline bag for forward flow. A second coaxial system was then advanced through the balloon catheter, which included the selected intermediate catheter, microcatheter, and microwire. In this scenario, the set up included a 137cm zoom 71 intermediate catheter, a Trevo Pro18 microcatheter, and 014 synchro soft wire. This system was advanced through the balloon guide catheter under the road-map function, with adequate back-flush at the rotating hemostatic valve at that back end of the balloon guide. Microcatheter and the intermediate catheter system were advanced through the terminal ICA and MCA to the level of the occlusion. The micro wire was then carefully advanced up to the occluded segment. Intermediate catheter was then advanced on the microcatheter and wire combination, up to the proximal face of the thrombus. The microcatheter and microwire were then gently removed and the rotating hemostatic valve was removed from the intermediate catheter. Direct aspiration was then performed through the zoom catheter. Zoom catheter was then gently withdrawn with full aspiration, while manipulating the balloon guide more proximally within the cervical artery. Zoom catheter was slowly withdrawn from the balloon guide. Angiogram was performed, confirming complete restoration of flow. Angiogram of the cervical ICA was performed. Balloon guide was then removed. The skin at the puncture site was then cleaned with Chlorhexidine. The 8 French sheath was removed and an 49F angioseal was deployed. Flat panel CT was performed. Patient was extubated once the CT was reviewed. Patient tolerated the procedure well and remained hemodynamically stable throughout. No complications were encountered and no significant blood loss  encountered. IMPRESSION: Status post ultrasound guided access right common femoral artery for left-sided cervical/cerebral angiogram and mechanical thrombectomy of left distal M1 occlusion using direct aspiration technique and achieving complete reperfusion of the occluded vessel. Angio-Seal for hemostasis. PLAN: The patient will remain intubated, given his comorbidities ICU status Target systolic blood pressure of 120-140  Right hip straight time 6 hours Frequent neurovascular checks Repeat neurologic imaging with CT and/MRI at the discretion of neurology team Electronically Signed   By: Gilmer Mor D.O.   On: 09/07/2020 10:18   DG CHEST PORT 1 VIEW  Result Date: 09/07/2020 CLINICAL DATA:  Respiratory distress. EXAM: PORTABLE CHEST 1 VIEW COMPARISON:  08/27/2020 FINDINGS: ET tube tip is above the carina. Stable cardiomediastinal contours. Diffuse pulmonary vascular congestion. Interval worsening aeration to the left midlung and left base which may represent airspace disease and or pleural effusion. IMPRESSION: Interval worsening aeration to the left midlung and left base which may represent airspace disease and/or pleural effusion. Electronically Signed   By: Signa Kell M.D.   On: 09/07/2020 10:48   ECHOCARDIOGRAM COMPLETE  Result Date: 09/07/2020    ECHOCARDIOGRAM REPORT   Patient Name:   Terry Howell. Date of Exam: 09/07/2020 Medical Rec #:  [id number]          Height: Accession #:    [id number]         Weight:       275.6 lb Date of Birth:  09/14/1956         BSA:          2.500 m Patient Age:    63 years           BP:           122/62 mmHg Patient Gender: M                  HR:           120 bpm. Exam Location:  Inpatient Procedure: Intracardiac Opacification Agent and 2D Echo Indications:    Stroke 434.91 / I163.9  History:        Patient has no prior history of Echocardiogram examinations.                 CHF, Stroke; Risk Factors:Hypertension and Diabetes. Acute                 ischemic left  MCA stroke , Acute Respiratory Failure.  Sonographer:    Jeryl Columbia Referring Phys: [id number] SUSHANTH R AROOR IMPRESSIONS  1. Left ventricular ejection fraction, by estimation, is 30 to 35%. The left ventricle has moderately decreased function. The left ventricle demonstrates regional wall motion abnormalities (see scoring diagram/findings for description). The left ventricular internal cavity size was mildly dilated. There is moderate left ventricular hypertrophy. Left ventricular diastolic parameters are indeterminate.  2. Right ventricular systolic function is normal. The right ventricular size is normal.  3. The mitral valve is degenerative. Mild mitral valve regurgitation. No evidence of mitral stenosis. Moderate mitral annular calcification.  4. The aortic valve is tricuspid. Aortic valve regurgitation is not visualized. Mild to moderate aortic valve sclerosis/calcification is present, without any evidence of aortic stenosis.  5. The inferior vena cava is dilated in size with >50% respiratory variability, suggesting right atrial pressure of 8 mmHg. Conclusion(s)/Recommendation(s): No intracardiac source of embolism detected on this transthoracic study. A transesophageal echocardiogram is recommended to exclude cardiac source of embolism if clinically indicated. FINDINGS  Left Ventricle: Left ventricular ejection fraction, by estimation, is 30 to 35%. The left ventricle has moderately decreased function. The left ventricle demonstrates regional wall motion abnormalities. The left ventricular internal cavity size was mildly dilated. There is moderate left ventricular hypertrophy. Left ventricular diastolic parameters are indeterminate.  LV Wall Scoring: The mid and distal anterior septum, entire apex, and  mid and distal inferior wall are akinetic. Right Ventricle: The right ventricular size is normal. No increase in right ventricular wall thickness. Right ventricular systolic function is normal. Left Atrium:  Left atrial size was normal in size. Right Atrium: Right atrial size was normal in size. Pericardium: There is no evidence of pericardial effusion. Mitral Valve: The mitral valve is degenerative in appearance. Moderate mitral annular calcification. Mild mitral valve regurgitation. No evidence of mitral valve stenosis. Tricuspid Valve: The tricuspid valve is normal in structure. Tricuspid valve regurgitation is not demonstrated. No evidence of tricuspid stenosis. Aortic Valve: The aortic valve is tricuspid. Aortic valve regurgitation is not visualized. Mild to moderate aortic valve sclerosis/calcification is present, without any evidence of aortic stenosis. Pulmonic Valve: The pulmonic valve was normal in structure. Pulmonic valve regurgitation is not visualized. No evidence of pulmonic stenosis. Aorta: The aortic root is normal in size and structure. Venous: The inferior vena cava is dilated in size with greater than 50% respiratory variability, suggesting right atrial pressure of 8 mmHg. IAS/Shunts: No atrial level shunt detected by color flow Doppler.  LEFT VENTRICLE PLAX 2D LVIDd:         5.70 cm  Diastology LVIDs:         4.20 cm  LV e' medial:    15.00 cm/s LV PW:         1.40 cm  LV E/e' medial:  7.5 LV IVS:        1.40 cm  LV e' lateral:   14.10 cm/s LVOT diam:     2.00 cm  LV E/e' lateral: 7.9 LVOT Area:     3.14 cm  RIGHT VENTRICLE RV S prime:     21.90 cm/s TAPSE (M-mode): 2.4 cm LEFT ATRIUM             Index       RIGHT ATRIUM           Index LA diam:        4.00 cm 1.60 cm/m  RA Area:     16.20 cm LA Vol (A2C):   68.8 ml 27.52 ml/m RA Volume:   44.70 ml  17.88 ml/m LA Vol (A4C):   95.9 ml 38.35 ml/m LA Biplane Vol: 85.2 ml 34.07 ml/m   AORTA Ao Root diam: 3.80 cm MITRAL VALVE MV Area (PHT): 5.97 cm     SHUNTS MV Decel Time: 127 msec     Systemic Diam: 2.00 cm MR Peak grad: 47.9 mmHg MR Vmax:      346.00 cm/s MV E velocity: 112.00 cm/s Donato SchultzMark Skains MD Electronically signed by Donato SchultzMark Skains MD  Signature Date/Time: 09/07/2020/1:40:29 PM    Final    IR PERCUTANEOUS ART THROMBECTOMY/INFUSION INTRACRANIAL INC DIAG ANGIO  Result Date: 09/07/2020 INDICATION: 63-year-ol male presents for mechanical thrombectomy of left emergent large vessel occlusion involving the left M1 EXAM: ULTRASOUND-GUIDED ACCESS RIGHT COMMON FEMORAL ARTERY CERVICAL AND CEREBRAL ANGIOGRAM MECHANICAL THROMBECTOMY LEFT MCA ANGIO-SEAL FOR HEMOSTASIS COMPARISON:  CT imaging same day MEDICATIONS: None ANESTHESIA/SEDATION: The anesthesia team was present to provide general endotracheal tube anesthesia and for patient monitoring during the procedure. Intubation was performed in negative pressure Bay in neuro IR holding. Left radial arterial line was performed by the anesthesia team. Interventional neuro radiology nursing staff was also present. CONTRAST:  60 cc contrast FLUOROSCOPY TIME:  Fluoroscopy Time: 8 minutes 48 seconds (778 mGy). COMPLICATIONS: None TECHNIQUE: Informed written consent was obtained from the patient's family after a thorough discussion of the procedural  risks, benefits and alternatives. Specific risks discussed include: Bleeding, infection, contrast reaction, kidney injury/failure, need for further procedure/surgery, arterial injury or dissection, embolization to new territory, intracranial hemorrhage (10-15% risk), neurologic deterioration, cardiopulmonary collapse, death. All questions were addressed. Maximal Sterile Barrier Technique was utilized including during the procedure including caps, mask, sterile gowns, sterile gloves, sterile drape, hand hygiene and skin antiseptic. A timeout was performed prior to the initiation of the procedure. The anesthesia team was present to provide general endotracheal tube anesthesia and for patient monitoring during the procedure. Interventional neuro radiology nursing staff was also present. FINDINGS: Initial Findings: Left common carotid artery:  Normal course caliber and  contour. Left external carotid artery: Patent with antegrade flow. Left internal carotid artery: Normal course caliber and contour of the cervical portion. Vertical and petrous segment patent with normal course caliber contour. Cavernous segment patent. Clinoid segment patent. Antegrade flow of the ophthalmic artery. Ophthalmic segment patent. Terminus patent. Left MCA: No significant atherosclerotic changes are narrowing of the left MCA. There is a fairly early division/origin of the MCA, with an upgoing branch just beyond the lenticulostriate arteries, superior division. There is a orbital frontal or frontal polar branch in a downgoing configuration. The dominant branch is the inferior division, which is apparently the dominant division given its supply to the parietal territory after reperfusion. Of note, there is an M4 occlusion of the early superior branch, which remains occluded throughout. Left ACA: A 1 segment patent. A 2 segment perfuses the right territory. Significant p.o. collaterals are identified, attempting to reperfuse the parietal territory and the temporal territory from the ACA distribution in the watershed area. Completion Findings: Left MCA: Complete reperfusion of the dominant inferior division of the M1 with no embolization new territory. Flat panel CT: No hemorrhage. There is stagnant focus of contrast associated with the M4 occlusion, within a separate arterial territory than the treated M1 segment. PROCEDURE: The anesthesia team was present to provide general endotracheal tube anesthesia and for patient monitoring during the procedure. Intubation was performed in negative pressure [place] in neuro IR holding. Interventional neuro radiology nursing staff was also present. Ultrasound survey of the right inguinal region was performed with images stored and sent to PACs. 11 blade scalpel was used to make a small incision. Blunt dissection was performed with US guidance. A micropuncture needle was  used access the right common femoral artery under ultrasound. With excellent arterial blood flow returned, an .018 micro wire was passed through the needle, observed to enter the abdominal aorta under fluoroscopy. The needle was removed, and a micropuncture sheath was placed over the wire. The inner dilator and wire were removed, and an 035 wire was advanced under fluoroscopy into the abdominal aorta. The sheath was removed and a 25cm 16F straight vascular sheath was placed. The dilator was removed and the sheath was flushed. Sheath was attached to pressurized and heparinized saline bag for constant forward flow. A coaxial system was then advanced over the 035 wire. This included a 95cm 087 "Walrus" balloon guide with coaxial 125cm Berenstein diagnostic catheter. This was advanced to the proximal descending thoracic aorta. Wire was then removed. Double flush of the catheter was performed. Catheter was then used to select the left common carotid artery. Angiogram was performed. Using roadmap technique, the catheter was advanced over a standard glide wire into left cervical ICA, with distal position achieved of the balloon guide. The diagnostic catheter and the wire were removed. Formal angiogram was performed. Road map function was used  once the occluded vessel was identified. Copious back flush was performed and the balloon catheter was attached to heparinized and pressurized saline bag for forward flow. A second coaxial system was then advanced through the balloon catheter, which included the selected intermediate catheter, microcatheter, and microwire. In this scenario, the set up included a 137cm zoom 71 intermediate catheter, a Trevo Pro18 microcatheter, and 014 synchro soft wire. This system was advanced through the balloon guide catheter under the road-map function, with adequate back-flush at the rotating hemostatic valve at that back end of the balloon guide. Microcatheter and the intermediate catheter system  were advanced through the terminal ICA and MCA to the level of the occlusion. The micro wire was then carefully advanced up to the occluded segment. Intermediate catheter was then advanced on the microcatheter and wire combination, up to the proximal face of the thrombus. The microcatheter and microwire were then gently removed and the rotating hemostatic valve was removed from the intermediate catheter. Direct aspiration was then performed through the zoom catheter. Zoom catheter was then gently withdrawn with full aspiration, while manipulating the balloon guide more proximally within the cervical artery. Zoom catheter was slowly withdrawn from the balloon guide. Angiogram was performed, confirming complete restoration of flow. Angiogram of the cervical ICA was performed. Balloon guide was then removed. The skin at the puncture site was then cleaned with Chlorhexidine. The 8 French sheath was removed and an 41F angioseal was deployed. Flat panel CT was performed. Patient was extubated once the CT was reviewed. Patient tolerated the procedure well and remained hemodynamically stable throughout. No complications were encountered and no significant blood loss encountered. IMPRESSION: Status post ultrasound guided access right common femoral artery for left-sided cervical/cerebral angiogram and mechanical thrombectomy of left distal M1 occlusion using direct aspiration technique and achieving complete reperfusion of the occluded vessel. Angio-Seal for hemostasis. PLAN: The patient will remain intubated, given his comorbidities ICU status Target systolic blood pressure of 120-140 Right hip straight time 6 hours Frequent neurovascular checks Repeat neurologic imaging with CT and/MRI at the discretion of neurology team Electronically Signed   By: Gilmer Mor D.O.   On: 09/07/2020 10:18    PHYSICAL EXAM  Temp:  [97 F (36.1 C)-99.3 F (37.4 C)] 99.3 F (37.4 C) (09/18 0900) Pulse Rate:  [76-119] 101 (09/18  0900) Resp:  [10-30] 30 (09/18 0900) BP: (78-174)/(47-111) 95/54 (09/18 0900) SpO2:  [96 %-100 %] 100 % (09/18 0900) Arterial Line BP: (87-148)/(45-83) 141/64 (09/18 0900) FiO2 (%):  [40 %-100 %] 40 % (09/18 0747) Weight:  [623 kg] 125 kg (09/17 2315)  General - Well nourished, well developed, intubated on sedation.  Ophthalmologic - fundi not visualized due to noncooperation.  Cardiovascular - Regular rate and rhythm.  Neuro - intubated on sedation, eyes open, but not following commands. With eye opening, eyes in mid position, however, left gaze preference, able to gaze to right though. Blinking to visual threat on the left consistently but inconsistent on the right, doll's eyes present, able to track objects more on the left visual field, PERRL. Corneal reflex present, gag and cough present. Breathing over the vent.  Facial symmetry not able to test due to ET tube.  Tongue protrusion not cooperative. Spontaneously moving LUE and LLE against gravity. On pain stimulation, RUE slight withdraw and RLE mild withdraw not against gravity. DTR 1+ and no babinski. Sensation, coordination and gait not tested.   ASSESSMENT/PLAN Mr. Marios Gaiser. is a 64 y.o. male with history  of hypertension, hyperlipidemia, uncontrolled insulin-dependent diabetes mellitus, history of coronary artery disease status post PCI, heart failure presented to the ED at [facility] with sudden onset aphasia and right-sided weakness. Tele-neurology consult -> IV tPA at [facility]. Tx'd to [facility] for IR intervention. 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 s/p tPA and IR with TICI3, embolic pattern, likely due to cardiomyopathy  CT Head - no acute finding     CTA H&N - OSH - Lt M1 occlusion   IR left M1 occlusion with TICI3 reperfusion  MRI head - pending  MRA head - pending  2D Echo EF 30-35%  EEG no seizure  [employer] Virus 2  - neg OSH  LDL - 50  HgbA1c - 8.7  UDS - neg  VTE prophylaxis - SCDs  No antithrombotic prior to admission, now on No antithrombotic s/p tPA  Patient will be counseled to be compliant with his antithrombotic medications  Ongoing aggressive stroke risk factor management  Therapy recommendations:  pending  Disposition:  Pending  Respiratory failure  Intubated for procedure  Continue to be extubated postprocedure  Failed the weaning trials due to tachypnea  CCM on board  May try extubation tomorrow  Cardiomyopathy CHF Hx of CAD s/p PCI  TTE EF 30-35%  Could be related to current stroke  On lasix 20 Q12h  Will consult cardiology  May consider anticoagulation given current stroke  Hx of hypertension hypotension  Home BP meds: none   Current BP meds: none   BP soft on Neo . SBP goal 120 - 140 per IR . Long-term BP goal normotensive  Hyperlipidemia  Home Lipid lowering medication: none   LDL 50, goal < 70  Consider statin once po access   Continue statin at discharge  Diabetes  Home diabetic meds: none   HgbA1c 8.7, goal < 7.0  SSI  CBG monitoring  Close PCP follow up for better DM control.   Other Stroke Risk Factors  Advanced age  Coronary artery disease  Congestive Heart Failure  Other Active Problems  Code status - Full code  Leukocytosis WBC 19.4  Hospital day # 1  This patient is critically ill due to left MCA stroke, status post TPA, status post IR thrombectomy, cardiomyopathy, CHF, hypotension, respiratory failure and at significant risk of neurological worsening, death form recurrent stroke, hemorrhagic conversion, cardio arrest, seizure, shock. This patient's care requires constant monitoring of vital signs, hemodynamics, respiratory and cardiac monitoring, review of multiple databases, neurological assessment, discussion with family, other specialists and medical decision making of high complexity. I spent 45 minutes  of neurocritical care time in the care of this patient.  Marvel Plan, MD PhD Stroke Neurology 09/07/2020 6:02 PM   To contact Stroke Continuity provider, please refer to [email or web address]. After hours, contact General Neurology

## 2020-09-07 NOTE — Progress Notes (Signed)
OT Cancellation Note  Patient Details Name: Terry Howell. MRN: [id number] DOB: 1956-06-07   Cancelled Treatment:    Reason Eval/Treat Not Completed: Active bedrest order. Will return as schedule allows. Thank you.  Tyrome Donatelli M Ruhama Lehew Xzayvier Fagin MSOT, OTR/L Acute Rehab Pager: [phone] Office: [phone] 09/07/2020, 8:00 AM

## 2020-09-07 NOTE — Procedures (Signed)
History: 64 year old male being evaluated for seizures after left M1 occlusion  Sedation: Propofol  Technique: This is a 21 channel routine scalp EEG performed at the bedside with bipolar and monopolar montages arranged in accordance to the international 10/20 system of electrode placement. One channel was dedicated to EKG recording.    Background: The background consists of generalized irregular delta and to a lesser extent theta range activities with some superimposed low voltage beta range activity..  Following nailbed stimulation, there is some increase in theta range activities, but no definite posterior dominant rhythm seen.  No epileptiform discharges were seen.  Photic stimulation: Physiologic driving is not performed  EEG Abnormalities: 1) generalized irregular slow activity 2) absent posterior dominant rhythm  Clinical Interpretation: This EEG is consistent with a generalized nonspecific cerebral dysfunction (encephalopathy), as can be contributed to by sedating medications.  There was no seizure or seizure predisposition recorded on this study. Please note that lack of epileptiform activity on EEG does not preclude the possibility of epilepsy.   Ritta Slot, MD [facility] [phone]  If 7pm- 7am, please page neurology on call as listed in AMION.

## 2020-09-07 NOTE — Progress Notes (Signed)
*  PRELIMINARY RESULTS* Echocardiogram 2D Echocardiogram with definity has been performed.  Jeryl Columbia 09/07/2020, 12:58 PM

## 2020-09-07 NOTE — Consult Note (Signed)
NAME:  Terry Howell, Terry Howell MRN:  [id number], DOB:  1956-02-27, LOS: 1 ADMISSION DATE:  09-12-20, CONSULTATION DATE:  09/18 REFERRING MD: Dr Laurence Slate , CHIEF COMPLAINT: MCA CVA  Brief History   64 year old white male that presented with acute onset CVA.  History of present illness   63 year old white male who presented from home to [facility].  Was discovered on the floor by the patient's wife after falling from standing position.  Patient presented with right-sided weakness aphasia and right facial droop.  Last known normal time was at 1600 hrs. 09/17.  Patient did have a cough for approximately 1 week but is Covid negative.  TPA was administered at 2003 hrs. on 09-12-2020.  Past Medical History  Hypertension Diabetes Hyperlipidemia  Consults:  Neurology  Procedures:  Thrombectomy  Significant Diagnostic Tests:  CT head 09/17: Impression: 1.  No acute intercranial hemorrhage or cortically based infarct identified but suspicious density of the left MCA M1 segment consider emergent large vessel occlusion in the appropriate setting. 2.  Brain parenchyma appears normal for age   Objective   Blood pressure 110/77, pulse 88, temperature (!) 97 F (36.1 C), resp. rate 14, weight 125 kg, SpO2 100 %.    Vent Mode: PRVC FiO2 (%):  [100 %] 100 % Set Rate:  [14 bmp] 14 bmp Vt Set:  [550 mL] 550 mL PEEP:  [5 cmH20] 5 cmH20 Plateau Pressure:  [26 cmH20] 26 cmH20   Intake/Output Summary (Last 24 hours) at 09/07/2020 0054 Last data filed at 09/07/2020 0030 Gross per 24 hour  Intake 600 ml  Output 1075 ml  Net -475 ml   Filed Weights   09-12-20 2315  Weight: 125 kg    Examination: General: Atraumatic HENT: Atraumatic/normocephalic.  Orally intubated.  Mucous membranes are moist. Lungs: Bilateral rales no wheezing.  Bilateral breath sounds intact. Cardiovascular: Regular rate no rub murmur gallop appreciated. Abdomen: Soft, nondistended, positive bowel sounds, no  rebound/rigidity/guarding the limited by neurologic status. Extremities: Distal pulse intact x4.  +1 pedal edema.  No acrocyanosis.  No clubbing. Neuro: RASS -4.  Chemically sedated. GU: Foley catheter intact.    Assessment & Plan:  Acute respiratory failure Status post thrombectomy Status post IV TPA Left M1 occlusive CVA Congestive heart failure with associated pulmonary edema  Plan: Patient remained intubated postoperatively.  We will leave him intubated at this time and move towards spontaneous awakening trial our next 10 to 12 hours.  Once appropriate we will move towards extubation. Serial neuro checks. Currently on propofol with a RASS of -4.  Will titrate as needed. Will obtain full set of labs at this time. Monitor I/os. Lasix 20 mg IV every 12x2 doses first dose being now.    Best practice:  Diet: N.p.o. Pain/Anxiety/Delirium protocol (if indicated): Protonix VAP protocol (if indicated): Initiated DVT prophylaxis: SCDs only GI prophylaxis: Protonix Glucose control: Insulin sliding scale Mobility: Bedrest Code Status: Full code Family Communication:  Disposition: Transfer to the intensive care unit postoperatively  Labs   CBC: No results for input(s): WBC, NEUTROABS, HGB, HCT, MCV, PLT in the last 168 hours.  Basic Metabolic Panel: No results for input(s): NA, K, CL, CO2, GLUCOSE, BUN, CREATININE, CALCIUM, MG, PHOS in the last 168 hours. GFR: CrCl cannot be calculated (No successful lab value found.). No results for input(s): PROCALCITON, WBC, LATICACIDVEN in the last 168 hours.  Liver Function Tests: No results for input(s): AST, ALT, ALKPHOS, BILITOT, PROT, ALBUMIN in the last 168 hours. No results  for input(s): LIPASE, AMYLASE in the last 168 hours. No results for input(s): AMMONIA in the last 168 hours.  ABG No results found for: PHART, PCO2ART, PO2ART, HCO3, TCO2, ACIDBASEDEF, O2SAT   Coagulation Profile: No results for input(s): INR, PROTIME in  the last 168 hours.  Cardiac Enzymes: No results for input(s): CKTOTAL, CKMB, CKMBINDEX, TROPONINI in the last 168 hours.  HbA1C: No results found for: HGBA1C  CBG: Recent Labs  Lab 10-04-2020 2319  GLUCAP 141*    Review of Systems:   Unable to be completed secondary to patient's neurologic status.  Past Medical History  Hypertension Diabetes mellitus type 2 Hyperlipidemia  Surgical History   Unknown  Social History  Unknown  Family History   His family history is not on file.   Allergies Not on File   Home Medications  Prior to Admission medications   Not on File     Critical care time:69mins

## 2020-09-07 NOTE — Progress Notes (Signed)
PT Cancellation Note  Patient Details Name: Terry Howell. MRN: [id number] DOB: 05-15-1956   Cancelled Treatment:    Reason Eval/Treat Not Completed: Patient not medically ready; patient with active bedrest orders and intubated.  Will attempt again another day.   Elray Mcgregor 09/07/2020, 8:41 AM Sheran Lawless, PT Acute Rehabilitation Services Pager:[phone] Office:[phone] 09/07/2020

## 2020-09-07 NOTE — Progress Notes (Signed)
    Referring Physician(s): Code stroke- Aroor, Sushanth R (neurology)  Supervising Physician: Gilmer Mor  Patient Status:  Terry Howell - In-pt  Chief Complaint: None- intubated with sedation  Subjective:  History of acute CVA s/p cerebral arteriogram with emergent mechanical thrombectomy of left MCA M1 occlusion achieving a TICI 3 revascularization via right femoral approach 08/27/2020 by Dr. Loreta Ave. Patient awake laying in bed intubated with sedation. Eyes follow in room but does not follow simple commands. Left side moving spontaneously no movements of right side. Right femoral puncture site c/d/i.   Allergies: Patient has no known allergies.  Medications: Prior to Admission medications   Not on File     Vital Signs: BP (!) 95/54   Pulse (!) 101   Temp 99.3 F (37.4 C)   Resp (!) 30   Wt 275 lb 9.2 oz (125 kg) Comment: estimate  SpO2 100%   Physical Exam Vitals and nursing note reviewed.  Constitutional:      General: He is not in acute distress.    Comments: Intubated with sedation.  Pulmonary:     Effort: Pulmonary effort is normal. No respiratory distress.     Comments: Intubated with sedation. Skin:    General: Skin is warm and dry.     Comments: Right femoral puncture site soft without active bleeding or hematoma.  Neurological:     Mental Status: He is alert.     Comments: Intubated with sedation. Awake and alert, eyes follow around room, but does not follow simple commands. PERRL bilaterally. Left side moving spontaneously no movements of right side. Distal pulses (DPs, PTs) palpable bilaterally with      Imaging: No results found.  Labs:  CBC: Recent Labs    09/07/20 0336  HGB 10.2*  HCT 30.0*    COAGS: No results for input(s): INR, APTT in the last 8760 hours.  BMP: Recent Labs    09/07/20 0336  NA 142  K 3.7     Assessment and Plan:  History of acute CVA s/p cerebral arteriogram with emergent mechanical thrombectomy of left  MCA M1 occlusion achieving a TICI 3 revascularization via right femoral approach 09/05/2020 by Dr. Loreta Ave. Patient's condition stable- intubated with sedation, awake but does not follow simple commands, left side moving spontaneously no movements of right side. Right femoral puncture site stable, distal pulses (DPs, PTs) palpable bilaterally with Doppler. For MR/MRA brain/head versus CT head today. Further plans per neurology/CCM- appreciate and agree with management. Please call NIR with questions/concerns.   Electronically Signed: Elwin Mocha, PA-C 09/07/2020, 10:09 AM   I spent a total of 25 Minutes at the the patient's bedside AND on the patient's hospital floor or unit, greater than 50% of which was counseling/coordinating care for CVA s/p revascularization.

## 2020-09-07 NOTE — Plan of Care (Signed)
  Problem: Clinical Measurements: Goal: Will remain free from infection Outcome: Progressing   Problem: Elimination: Goal: Will not experience complications related to urinary retention Outcome: Progressing   Problem: Pain Managment: Goal: General experience of comfort will improve Outcome: Progressing   Problem: Safety: Goal: Ability to remain free from injury will improve Outcome: Progressing   Problem: Skin Integrity: Goal: Risk for impaired skin integrity will decrease Outcome: Progressing

## 2020-09-07 NOTE — Anesthesia Postprocedure Evaluation (Signed)
Anesthesia Post Note  Patient: Terry Howell.  Procedure(s) Performed: IR WITH ANESTHESIA (N/A )     Patient location during evaluation: PACU Anesthesia Type: General Level of consciousness: sedated and patient remains intubated per anesthesia plan Pain management: pain level controlled Vital Signs Assessment: post-procedure vital signs reviewed and stable Respiratory status: patient remains intubated per anesthesia plan and patient on ventilator - see flowsheet for VS Cardiovascular status: stable Anesthetic complications: no   No complications documented.  Last Vitals:  Vitals:   09/07/20 0630 09/07/20 0645  BP: (!) 81/48   Pulse: 77 76  Resp: 16 18  Temp: 37.1 C 37.1 C  SpO2: 96% 96%    Last Pain: There were no vitals filed for this visit.               Alaura Schippers COKER

## 2020-09-07 NOTE — Consult Note (Signed)
NAME:  Terry Howell, Terry Howell MRN:  [id number], DOB:  [birth year], LOS: 1 ADMISSION DATE:  09/09/20, CONSULTATION DATE:  09/18 REFERRING MD: Dr Laurence Slate , CHIEF COMPLAINT: MCA CVA  Brief History   64 year old white male that presented with acute onset CVA.  History of present illness   64 year old white male who presented from home to [facility].  Was discovered on the floor by the patient's wife after falling from standing position.  Patient presented with right-sided weakness aphasia and right facial droop.  Last known normal time was at 1600 hrs. 09/17.  Patient did have a cough for approximately 1 week but is Covid negative.  TPA was administered at 2003 hrs. on September 09, 2020.  Past Medical History  Hypertension Diabetes Hyperlipidemia  Consults:  Neurology  Procedures:  Thrombectomy  Significant Diagnostic Tests:  CT head 09/17: Impression: 1.  No acute intercranial hemorrhage or cortically based infarct identified but suspicious density of the left MCA M1 segment consider emergent large vessel occlusion in the appropriate setting. 2.  Brain parenchyma appears normal for age Interval Events:  Follows commands on left, not on right. Arouses to voice  Objective   Blood pressure 130/88, pulse (!) 102, temperature (!) 100.4 F (38 C), resp. rate (!) 23, weight 125 kg, SpO2 100 %.    Vent Mode: PSV;CPAP FiO2 (%):  [40 %-100 %] 40 % Set Rate:  [14 bmp] 14 bmp Vt Set:  [550 mL] 550 mL PEEP:  [5 cmH20] 5 cmH20 Pressure Support:  [5 cmH20-10 cmH20] 5 cmH20 Plateau Pressure:  [24 cmH20-26 cmH20] 24 cmH20   Intake/Output Summary (Last 24 hours) at 09/07/2020 1620 Last data filed at 09/07/2020 1300 Gross per 24 hour  Intake 1289.74 ml  Output 2775 ml  Net -1485.26 ml   Filed Weights   2020-09-09 2315  Weight: 125 kg    Examination: General: Atraumatic HENT: Atraumatic/normocephalic.  Orally intubated.  Mucous membranes are moist. Lungs:Mechanical sounds.  Bilateral  breath sounds intact. Cardiovascular: Regular rate no rub murmur gallop appreciated. Abdomen: Soft, nondistended, positive bowel sounds, no rebound/rigidity/guarding the limited by neurologic status. Extremities: Distal pulse intact x4.  +2 pedal edema.  No acrocyanosis.  No clubbing. Neuro: RASS -4.  Chemically sedated. GU: Foley catheter intact.    Assessment & Plan:  Acute respiratory failure: Due to pulmonary edema and post operative state. Did well 10/5 but with elevated RSBI 5/5 --Diurese --Vent support --SBT in AM --minimize propofol  Left M1 occlusive CVA Status post thrombectomy Status post IV TPA --Per neuro  Congestive heart failure with associated pulmonary edema --Diurese  Best practice:  Diet: N.p.o. Pain/Anxiety/Delirium protocol (if indicated): Protonix VAP protocol (if indicated): Initiated DVT prophylaxis: SCDs only GI prophylaxis: Protonix Glucose control: Insulin sliding scale Mobility: Bedrest Code Status: Full code Family Communication:   Disposition: ICU  Labs   CBC: Recent Labs  Lab 09/07/20 0336 09/07/20 0935  WBC  --  19.4*  HGB 10.2* 10.5*  HCT 30.0* 33.5*  MCV  --  100.9*  PLT  --  197    Basic Metabolic Panel: Recent Labs  Lab 09/07/20 0336 09/07/20 0935  NA 142 141  K 3.7 3.7  CL  --  107  CO2  --  22  GLUCOSE  --  162*  BUN  --  34*  CREATININE  --  1.11  CALCIUM  --  8.4*  MG  --  1.9  PHOS  --  3.6   GFR: CrCl cannot be calculated (  Unknown ideal weight.). Recent Labs  Lab 09/07/20 0935  WBC 19.4*    Liver Function Tests: No results for input(s): AST, ALT, ALKPHOS, BILITOT, PROT, ALBUMIN in the last 168 hours. No results for input(s): LIPASE, AMYLASE in the last 168 hours. No results for input(s): AMMONIA in the last 168 hours.  ABG    Component Value Date/Time   PHART 7.428 09/07/2020 0336   PCO2ART 35.5 09/07/2020 0336   PO2ART 306 (H) 09/07/2020 0336   HCO3 23.5 09/07/2020 0336   TCO2 25 09/07/2020  0336   ACIDBASEDEF 1.0 09/07/2020 0336   O2SAT 100.0 09/07/2020 0336     Coagulation Profile: No results for input(s): INR, PROTIME in the last 168 hours.  Cardiac Enzymes: No results for input(s): CKTOTAL, CKMB, CKMBINDEX, TROPONINI in the last 168 hours.  HbA1C: Hgb A1c MFr Bld  Date/Time Value Ref Range Status  09/07/2020 09:35 AM 8.7 (H) 4.8 - 5.6 % Final    Comment:    (NOTE) Pre diabetes:          5.7%-6.4%  Diabetes:              >6.4%  Glycemic control for   <7.0% adults with diabetes     CBG: Recent Labs  Lab September 08, 2020 2319 09/07/20 0323 09/07/20 0823 09/07/20 1207 09/07/20 1537  GLUCAP 141* 157* 153* 190* 174*    Review of Systems:   Unable to be completed secondary to patient's neurologic status.  Past Medical History  Hypertension Diabetes mellitus type 2 Hyperlipidemia  Surgical History   Unknown  Social History  Unknown  Family History   His family history is not on file.   Allergies No Known Allergies   Home Medications  Prior to Admission medications   Not on File     Critical care time: CRITICAL CARE Performed by: Karren Burly   Total critical care time: 30 minutes  Critical care time was exclusive of separately billable procedures and treating other patients.  Critical care was necessary to treat or prevent imminent or life-threatening deterioration.  Critical care was time spent personally by me on the following activities: development of treatment plan with patient and/or surrogate as well as nursing, discussions with consultants, evaluation of patient's response to treatment, examination of patient, obtaining history from patient or surrogate, ordering and performing treatments and interventions, ordering and review of laboratory studies, ordering and review of radiographic studies, pulse oximetry and re-evaluation of patient's condition.

## 2020-09-08 ENCOUNTER — Inpatient Hospital Stay (HOSPITAL_COMMUNITY): Payer: Medicare Other

## 2020-09-08 DIAGNOSIS — I5023 Acute on chronic systolic (congestive) heart failure: Secondary | ICD-10-CM

## 2020-09-08 DIAGNOSIS — I25119 Atherosclerotic heart disease of native coronary artery with unspecified angina pectoris: Secondary | ICD-10-CM

## 2020-09-08 DIAGNOSIS — I10 Essential (primary) hypertension: Secondary | ICD-10-CM

## 2020-09-08 DIAGNOSIS — R509 Fever, unspecified: Secondary | ICD-10-CM

## 2020-09-08 DIAGNOSIS — E1165 Type 2 diabetes mellitus with hyperglycemia: Secondary | ICD-10-CM

## 2020-09-08 LAB — GLUCOSE, CAPILLARY
Glucose-Capillary: 192 mg/dL — ABNORMAL HIGH (ref 70–99)
Glucose-Capillary: 214 mg/dL — ABNORMAL HIGH (ref 70–99)
Glucose-Capillary: 237 mg/dL — ABNORMAL HIGH (ref 70–99)
Glucose-Capillary: 276 mg/dL — ABNORMAL HIGH (ref 70–99)
Glucose-Capillary: 312 mg/dL — ABNORMAL HIGH (ref 70–99)

## 2020-09-08 LAB — CBC
HCT: 32.9 % — ABNORMAL LOW (ref 39.0–52.0)
Hemoglobin: 10.4 g/dL — ABNORMAL LOW (ref 13.0–17.0)
MCH: 32.8 pg (ref 26.0–34.0)
MCHC: 31.6 g/dL (ref 30.0–36.0)
MCV: 103.8 fL — ABNORMAL HIGH (ref 80.0–100.0)
Platelets: 185 10*3/uL (ref 150–400)
RBC: 3.17 MIL/uL — ABNORMAL LOW (ref 4.22–5.81)
RDW: 14.6 % (ref 11.5–15.5)
WBC: 20.6 10*3/uL — ABNORMAL HIGH (ref 4.0–10.5)
nRBC: 0 % (ref 0.0–0.2)

## 2020-09-08 LAB — URINALYSIS, COMPLETE (UACMP) WITH MICROSCOPIC
Bacteria, UA: NONE SEEN
Bilirubin Urine: NEGATIVE
Glucose, UA: NEGATIVE mg/dL
Hgb urine dipstick: NEGATIVE
Ketones, ur: 5 mg/dL — AB
Leukocytes,Ua: NEGATIVE
Nitrite: NEGATIVE
Protein, ur: NEGATIVE mg/dL
Specific Gravity, Urine: 1.013 (ref 1.005–1.030)
pH: 5 (ref 5.0–8.0)

## 2020-09-08 LAB — MAGNESIUM
Magnesium: 1.8 mg/dL (ref 1.7–2.4)
Magnesium: 1.8 mg/dL (ref 1.7–2.4)

## 2020-09-08 LAB — BASIC METABOLIC PANEL
Anion gap: 13 (ref 5–15)
BUN: 31 mg/dL — ABNORMAL HIGH (ref 8–23)
CO2: 20 mmol/L — ABNORMAL LOW (ref 22–32)
Calcium: 8.1 mg/dL — ABNORMAL LOW (ref 8.9–10.3)
Chloride: 108 mmol/L (ref 98–111)
Creatinine, Ser: 1.1 mg/dL (ref 0.61–1.24)
GFR calc Af Amer: >60 mL/min (ref >60)
GFR calc non Af Amer: >60 mL/min (ref >60)
Glucose, Bld: 213 mg/dL — ABNORMAL HIGH (ref 70–99)
Potassium: 3.8 mmol/L (ref 3.5–5.1)
Sodium: 141 mmol/L (ref 135–145)

## 2020-09-08 LAB — PHOSPHORUS
Phosphorus: 3.2 mg/dL (ref 2.5–4.6)
Phosphorus: 3.3 mg/dL (ref 2.5–4.6)

## 2020-09-08 LAB — BRAIN NATRIURETIC PEPTIDE: B Natriuretic Peptide: 611.1 pg/mL — ABNORMAL HIGH (ref 0.0–100.0)

## 2020-09-08 MED ORDER — FUROSEMIDE 10 MG/ML IJ SOLN
40.0000 mg | Freq: Two times a day (BID) | INTRAMUSCULAR | Status: AC
  Administered 2020-09-08: 40 mg via INTRAVENOUS
  Filled 2020-09-08: qty 4

## 2020-09-08 MED ORDER — FENTANYL CITRATE (PF) 100 MCG/2ML IJ SOLN
INTRAMUSCULAR | Status: AC
  Filled 2020-09-08: qty 2

## 2020-09-08 MED ORDER — FENTANYL CITRATE (PF) 100 MCG/2ML IJ SOLN
100.0000 ug | INTRAMUSCULAR | Status: DC | PRN
Start: 2020-09-08 — End: 2020-09-08

## 2020-09-08 MED ORDER — ASPIRIN 300 MG RE SUPP
300.0000 mg | Freq: Every day | RECTAL | Status: DC
  Administered 2020-09-08: 300 mg via RECTAL
  Filled 2020-09-08: qty 1

## 2020-09-08 MED ORDER — PROSOURCE TF PO LIQD
45.0000 mL | Freq: Two times a day (BID) | ORAL | Status: DC
  Administered 2020-09-08 – 2020-09-09 (×2): 45 mL
  Filled 2020-09-08 (×2): qty 45

## 2020-09-08 MED ORDER — VITAL HIGH PROTEIN PO LIQD
1000.0000 mL | ORAL | Status: DC
  Administered 2020-09-08: 1000 mL
  Filled 2020-09-08: qty 1000

## 2020-09-08 MED ORDER — FENTANYL CITRATE (PF) 100 MCG/2ML IJ SOLN
25.0000 ug | INTRAMUSCULAR | Status: DC | PRN
  Administered 2020-09-08: 75 ug via INTRAVENOUS
  Administered 2020-09-08: 50 ug via INTRAVENOUS
  Administered 2020-09-08 – 2020-09-10 (×6): 25 ug via INTRAVENOUS
  Filled 2020-09-08 (×4): qty 2

## 2020-09-08 NOTE — Progress Notes (Signed)
STROKE TEAM PROGRESS NOTE   INTERVAL HISTORY His RN is at the bedside.  Pt is still intubated on low dose propofol. He is more awake than yesterday and able to open eyes spontaneously. However, still not following commands. Discussed with CCM Dr. Judeth Horn and he will diuresis him today to see if he can be extubated. So far he is on weaning trial but needs high PEEP and pressure support. 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 MRA showed left MCA patent. No po access at this time.   Per wife, pt is DNR and pt made clear before that he will not be kept on life support if things go wrong. He takes his DNR with him going to his PCP to make sure everybody knows it. He did not even want to come to ER this time and wife had to call EMS but wife stated that if pt condition getting worse, she needs to be notified and she does not want heroic measures to keep him alive with life support as pt wanted "to die peacefully". I have updated the chart to reflect DNR.  OBJECTIVE Vitals:   09/08/20 0700 09/08/20 0735 09/08/20 0800 09/08/20 0900  BP: 113/63 133/63 114/69 (!) 115/58  Pulse: 87 87 87 92  Resp: (!) 22 (!) 28 (!) 26 (!) 34  Temp: 99.5 F (37.5 C) 99.9 F (37.7 C) 99.9 F (37.7 C) 100 F (37.8 C)  TempSrc:   Bladder   SpO2: 100% 100% 100% 100%  Weight:        CBC:  Recent Labs  Lab 09/07/20 0935 09/08/20 0706  WBC 19.4* 20.6*  HGB 10.5* 10.4*  HCT 33.5* 32.9*  MCV 100.9* 103.8*  PLT 197 185    Basic Metabolic Panel:  Recent Labs  Lab 09/07/20 0935 09/08/20 0706  NA 141 141  K 3.7 3.8  CL 107 108  CO2 22 20*  GLUCOSE 162* 213*  BUN 34* 31*  CREATININE 1.11 1.10  CALCIUM 8.4* 8.1*  MG 1.9  --   PHOS 3.6  --     Lipid Panel:     Component Value Date/Time   CHOL 97 09/07/2020 0935   TRIG 86 09/07/2020 0935   HDL 30 (L) 09/07/2020 0935   CHOLHDL 3.2 09/07/2020 0935   VLDL 17 09/07/2020 0935   LDLCALC 50 09/07/2020 0935    HgbA1c:  Lab Results  Component Value Date   HGBA1C 8.7 (H) 09/07/2020   Urine Drug Screen:     Component Value Date/Time   LABOPIA NONE DETECTED 09/07/2020 0935   COCAINSCRNUR NONE DETECTED 09/07/2020 0935   LABBENZ NONE DETECTED 09/07/2020 0935   AMPHETMU NONE DETECTED 09/07/2020 0935   THCU NONE DETECTED 09/07/2020 0935   LABBARB NONE DETECTED 09/07/2020 0935    Alcohol Level No results found for: ETH  IMAGING  MR ANGIO HEAD WO CONTRAST  Result Date: 09/07/2020 CLINICAL DATA:  64 year old male code stroke presentation yesterday with left MCA ELVO at [facility]. Status post IV tPA and endovascular revascularization. EXAM: MRI HEAD WITHOUT CONTRAST MRA HEAD WITHOUT CONTRAST TECHNIQUE: Multiplanar, multiecho pulse sequences of the brain and surrounding structures were obtained without intravenous contrast. Angiographic images of the head were obtained using MRA technique without contrast. COMPARISON:  [facility] CT head and CTA head and neck yesterday. FINDINGS: MRI HEAD FINDINGS Brain: Patchy restricted diffusion and petechial hemorrhage in the left basal ganglia which are mildly expanded (series 6, image 85, series  14, image 32). Heidelberg Classification type 1c vs. type 2 petechial blood. Mild additional mostly punctate but occasional patchy scattered restricted diffusion in the superior left temporal lobe, posterior left insula. No additional blood products. No contralateral or posterior fossa restricted diffusion. No midline shift, ventriculomegaly, extra-axial collection. Outside of the acute findings the gray and white matter signal is largely normal for age. Cervicomedullary junction and pituitary are within normal limits. Vascular: Major intracranial vascular flow voids are preserved. Skull and upper cervical spine: Negative for age visible cervical spine. Visualized bone marrow signal is within normal limits. Sinuses/Orbits: Postoperative changes to both globes.  Minor ethmoid and sphenoid sinus disease. Other: Intubated. Small volume of fluid layering in the pharynx. Mastoids are clear. Visible internal auditory structures appear normal. MRA HEAD FINDINGS Antegrade flow in the posterior circulation with mildly dominant left vertebral artery. No distal vertebral or basilar stenosis. Patent PICA origins, AICA, SCA and PCA origins. Posterior communicating arteries are diminutive or absent. Bilateral PCA branches are within normal limits. Antegrade flow in both ICA siphons. No siphon stenosis. Normal ophthalmic artery origins. Patent carotid termini. Patent MCA and ACA origins. Anterior communicating artery, visible ACA branches, right MCA M1 and visible right MCA branches are within normal limits. Patent left MCA M1 and bifurcation now. No significant vessel irregularity. Visible left MCA branches are within normal limits. IMPRESSION: 1. Left lenticulostriate territory infarcts with patchy petechial hemorrhage and mildly expanded left basal ganglia. No significant intracranial mass effect. Elsewhere there are a few scattered punctate and occasionally patchy infarcts in the Left MCA territory. 2. Normal Left MCA and negative intracranial MRA. 3. Outside of the left MCA territory negative for age MRI appearance of the brain. Electronically Signed   By: Odessa Fleming M.D.   On: 09/07/2020 19:22   MR BRAIN WO CONTRAST  Result Date: 09/07/2020 CLINICAL DATA:  64 year old male code stroke presentation yesterday with left MCA ELVO at [facility]. Status post IV tPA and endovascular revascularization. EXAM: MRI HEAD WITHOUT CONTRAST MRA HEAD WITHOUT CONTRAST TECHNIQUE: Multiplanar, multiecho pulse sequences of the brain and surrounding structures were obtained without intravenous contrast. Angiographic images of the head were obtained using MRA technique without contrast. COMPARISON:  [facility] CT head and CTA head and neck yesterday. FINDINGS: MRI HEAD FINDINGS Brain:  Patchy restricted diffusion and petechial hemorrhage in the left basal ganglia which are mildly expanded (series 6, image 85, series 14, image 32). Heidelberg Classification type 1c vs. type 2 petechial blood. Mild additional mostly punctate but occasional patchy scattered restricted diffusion in the superior left temporal lobe, posterior left insula. No additional blood products. No contralateral or posterior fossa restricted diffusion. No midline shift, ventriculomegaly, extra-axial collection. Outside of the acute findings the gray and white matter signal is largely normal for age. Cervicomedullary junction and pituitary are within normal limits. Vascular: Major intracranial vascular flow voids are preserved. Skull and upper cervical spine: Negative for age visible cervical spine. Visualized bone marrow signal is within normal limits. Sinuses/Orbits: Postoperative changes to both globes. Minor ethmoid and sphenoid sinus disease. Other: Intubated. Small volume of fluid layering in the pharynx. Mastoids are clear. Visible internal auditory structures appear normal. MRA HEAD FINDINGS Antegrade flow in the posterior circulation with mildly dominant left vertebral artery. No distal vertebral or basilar stenosis. Patent PICA origins, AICA, SCA and PCA origins. Posterior communicating arteries are diminutive or absent. Bilateral PCA branches are within normal limits. Antegrade flow in both ICA siphons. No siphon stenosis. Normal ophthalmic artery origins.  Patent carotid termini. Patent MCA and ACA origins. Anterior communicating artery, visible ACA branches, right MCA M1 and visible right MCA branches are within normal limits. Patent left MCA M1 and bifurcation now. No significant vessel irregularity. Visible left MCA branches are within normal limits. IMPRESSION: 1. Left lenticulostriate territory infarcts with patchy petechial hemorrhage and mildly expanded left basal ganglia. No significant intracranial mass effect.  Elsewhere there are a few scattered punctate and occasionally patchy infarcts in the Left MCA territory. 2. Normal Left MCA and negative intracranial MRA. 3. Outside of the left MCA territory negative for age MRI appearance of the brain. Electronically Signed   By: Odessa Fleming M.D.   On: 09/07/2020 19:22   IR CT Head Ltd  Result Date: 09/07/2020 INDICATION: 63-year-ol male presents for mechanical thrombectomy of left emergent large vessel occlusion involving the left M1 EXAM: ULTRASOUND-GUIDED ACCESS RIGHT COMMON FEMORAL ARTERY CERVICAL AND CEREBRAL ANGIOGRAM MECHANICAL THROMBECTOMY LEFT MCA ANGIO-SEAL FOR HEMOSTASIS COMPARISON:  CT imaging same day MEDICATIONS: None ANESTHESIA/SEDATION: The anesthesia team was present to provide general endotracheal tube anesthesia and for patient monitoring during the procedure. Intubation was performed in negative pressure Bay in neuro IR holding. Left radial arterial line was performed by the anesthesia team. Interventional neuro radiology nursing staff was also present. CONTRAST:  60 cc contrast FLUOROSCOPY TIME:  Fluoroscopy Time: 8 minutes 48 seconds (778 mGy). COMPLICATIONS: None TECHNIQUE: Informed written consent was obtained from the patient's family after a thorough discussion of the procedural risks, benefits and alternatives. Specific risks discussed include: Bleeding, infection, contrast reaction, kidney injury/failure, need for further procedure/surgery, arterial injury or dissection, embolization to new territory, intracranial hemorrhage (10-15% risk), neurologic deterioration, cardiopulmonary collapse, death. All questions were addressed. Maximal Sterile Barrier Technique was utilized including during the procedure including caps, mask, sterile gowns, sterile gloves, sterile drape, hand hygiene and skin antiseptic. A timeout was performed prior to the initiation of the procedure. The anesthesia team was present to provide general endotracheal tube anesthesia and  for patient monitoring during the procedure. Interventional neuro radiology nursing staff was also present. FINDINGS: Initial Findings: Left common carotid artery:  Normal course caliber and contour. Left external carotid artery: Patent with antegrade flow. Left internal carotid artery: Normal course caliber and contour of the cervical portion. Vertical and petrous segment patent with normal course caliber contour. Cavernous segment patent. Clinoid segment patent. Antegrade flow of the ophthalmic artery. Ophthalmic segment patent. Terminus patent. Left MCA: No significant atherosclerotic changes are narrowing of the left MCA. There is a fairly early division/origin of the MCA, with an upgoing branch just beyond the lenticulostriate arteries, superior division. There is a orbital frontal or frontal polar branch in a downgoing configuration. The dominant branch is the inferior division, which is apparently the dominant division given its supply to the parietal territory after reperfusion. Of note, there is an M4 occlusion of the early superior branch, which remains occluded throughout. Left ACA: A 1 segment patent. A 2 segment perfuses the right territory. Significant p.o. collaterals are identified, attempting to reperfuse the parietal territory and the temporal territory from the ACA distribution in the watershed area. Completion Findings: Left MCA: Complete reperfusion of the dominant inferior division of the M1 with no embolization new territory. Flat panel CT: No hemorrhage. There is stagnant focus of contrast associated with the M4 occlusion, within a separate arterial territory than the treated M1 segment. PROCEDURE: The anesthesia team was present to provide general endotracheal tube anesthesia and for patient monitoring during the  procedure. Intubation was performed in negative pressure Bay in neuro IR holding. Interventional neuro radiology nursing staff was also present. Ultrasound survey of the right  inguinal region was performed with images stored and sent to PACs. 11 blade scalpel was used to make a small incision. Blunt dissection was performed with US guidance. A micropuncture needle was used access the right common femoral artery under ultrasound. With excellent arterial blood flow returned, an .018 micro wire was passed through the needle, observed to enter the abdominal aorta under fluoroscopy. The needle was removed, and a micropuncture sheath was placed over the wire. The inner dilator and wire were removed, and an 035 wire was advanced under fluoroscopy into the abdominal aorta. The sheath was removed and a 25cm 54F straight vascular sheath was placed. The dilator was removed and the sheath was flushed. Sheath was attached to pressurized and heparinized saline bag for constant forward flow. A coaxial system was then advanced over the 035 wire. This included a 95cm 087 "Walrus" balloon guide with coaxial 125cm Berenstein diagnostic catheter. This was advanced to the proximal descending thoracic aorta. Wire was then removed. Double flush of the catheter was performed. Catheter was then used to select the left common carotid artery. Angiogram was performed. Using roadmap technique, the catheter was advanced over a standard glide wire into left cervical ICA, with distal position achieved of the balloon guide. The diagnostic catheter and the wire were removed. Formal angiogram was performed. Road map function was used once the occluded vessel was identified. Copious back flush was performed and the balloon catheter was attached to heparinized and pressurized saline bag for forward flow. A second coaxial system was then advanced through the balloon catheter, which included the selected intermediate catheter, microcatheter, and microwire. In this scenario, the set up included a 137cm zoom 71 intermediate catheter, a Trevo Pro18 microcatheter, and 014 synchro soft wire. This system was advanced through the  balloon guide catheter under the road-map function, with adequate back-flush at the rotating hemostatic valve at that back end of the balloon guide. Microcatheter and the intermediate catheter system were advanced through the terminal ICA and MCA to the level of the occlusion. The micro wire was then carefully advanced up to the occluded segment. Intermediate catheter was then advanced on the microcatheter and wire combination, up to the proximal face of the thrombus. The microcatheter and microwire were then gently removed and the rotating hemostatic valve was removed from the intermediate catheter. Direct aspiration was then performed through the zoom catheter. Zoom catheter was then gently withdrawn with full aspiration, while manipulating the balloon guide more proximally within the cervical artery. Zoom catheter was slowly withdrawn from the balloon guide. Angiogram was performed, confirming complete restoration of flow. Angiogram of the cervical ICA was performed. Balloon guide was then removed. The skin at the puncture site was then cleaned with Chlorhexidine. The 8 French sheath was removed and an 54F angioseal was deployed. Flat panel CT was performed. Patient was extubated once the CT was reviewed. Patient tolerated the procedure well and remained hemodynamically stable throughout. No complications were encountered and no significant blood loss encountered. IMPRESSION: Status post ultrasound guided access right common femoral artery for left-sided cervical/cerebral angiogram and mechanical thrombectomy of left distal M1 occlusion using direct aspiration technique and achieving complete reperfusion of the occluded vessel. Angio-Seal for hemostasis. PLAN: The patient will remain intubated, given his comorbidities ICU status Target systolic blood pressure of 120-140 Right hip straight time 6 hours Frequent neurovascular  checks Repeat neurologic imaging with CT and/MRI at the discretion of neurology team  Electronically Signed   By: Gilmer Mor D.O.   On: 09/07/2020 10:18   IR US Guide Vasc Access Right  Result Date: 09/07/2020 INDICATION: 63-year-ol male presents for mechanical thrombectomy of left emergent large vessel occlusion involving the left M1 EXAM: ULTRASOUND-GUIDED ACCESS RIGHT COMMON FEMORAL ARTERY CERVICAL AND CEREBRAL ANGIOGRAM MECHANICAL THROMBECTOMY LEFT MCA ANGIO-SEAL FOR HEMOSTASIS COMPARISON:  CT imaging same day MEDICATIONS: None ANESTHESIA/SEDATION: The anesthesia team was present to provide general endotracheal tube anesthesia and for patient monitoring during the procedure. Intubation was performed in negative pressure Bay in neuro IR holding. Left radial arterial line was performed by the anesthesia team. Interventional neuro radiology nursing staff was also present. CONTRAST:  60 cc contrast FLUOROSCOPY TIME:  Fluoroscopy Time: 8 minutes 48 seconds (778 mGy). COMPLICATIONS: None TECHNIQUE: Informed written consent was obtained from the patient's family after a thorough discussion of the procedural risks, benefits and alternatives. Specific risks discussed include: Bleeding, infection, contrast reaction, kidney injury/failure, need for further procedure/surgery, arterial injury or dissection, embolization to new territory, intracranial hemorrhage (10-15% risk), neurologic deterioration, cardiopulmonary collapse, death. All questions were addressed. Maximal Sterile Barrier Technique was utilized including during the procedure including caps, mask, sterile gowns, sterile gloves, sterile drape, hand hygiene and skin antiseptic. A timeout was performed prior to the initiation of the procedure. The anesthesia team was present to provide general endotracheal tube anesthesia and for patient monitoring during the procedure. Interventional neuro radiology nursing staff was also present. FINDINGS: Initial Findings: Left common carotid artery:  Normal course caliber and contour. Left external  carotid artery: Patent with antegrade flow. Left internal carotid artery: Normal course caliber and contour of the cervical portion. Vertical and petrous segment patent with normal course caliber contour. Cavernous segment patent. Clinoid segment patent. Antegrade flow of the ophthalmic artery. Ophthalmic segment patent. Terminus patent. Left MCA: No significant atherosclerotic changes are narrowing of the left MCA. There is a fairly early division/origin of the MCA, with an upgoing branch just beyond the lenticulostriate arteries, superior division. There is a orbital frontal or frontal polar branch in a downgoing configuration. The dominant branch is the inferior division, which is apparently the dominant division given its supply to the parietal territory after reperfusion. Of note, there is an M4 occlusion of the early superior branch, which remains occluded throughout. Left ACA: A 1 segment patent. A 2 segment perfuses the right territory. Significant p.o. collaterals are identified, attempting to reperfuse the parietal territory and the temporal territory from the ACA distribution in the watershed area. Completion Findings: Left MCA: Complete reperfusion of the dominant inferior division of the M1 with no embolization new territory. Flat panel CT: No hemorrhage. There is stagnant focus of contrast associated with the M4 occlusion, within a separate arterial territory than the treated M1 segment. PROCEDURE: The anesthesia team was present to provide general endotracheal tube anesthesia and for patient monitoring during the procedure. Intubation was performed in negative pressure Bay in neuro IR holding. Interventional neuro radiology nursing staff was also present. Ultrasound survey of the right inguinal region was performed with images stored and sent to PACs. 11 blade scalpel was used to make a small incision. Blunt dissection was performed with US guidance. A micropuncture needle was used access the right  common femoral artery under ultrasound. With excellent arterial blood flow returned, an .018 micro wire was passed through the needle, observed to enter the abdominal aorta under fluoroscopy.  The needle was removed, and a micropuncture sheath was placed over the wire. The inner dilator and wire were removed, and an 035 wire was advanced under fluoroscopy into the abdominal aorta. The sheath was removed and a 25cm 63F straight vascular sheath was placed. The dilator was removed and the sheath was flushed. Sheath was attached to pressurized and heparinized saline bag for constant forward flow. A coaxial system was then advanced over the 035 wire. This included a 95cm 087 "Walrus" balloon guide with coaxial 125cm Berenstein diagnostic catheter. This was advanced to the proximal descending thoracic aorta. Wire was then removed. Double flush of the catheter was performed. Catheter was then used to select the left common carotid artery. Angiogram was performed. Using roadmap technique, the catheter was advanced over a standard glide wire into left cervical ICA, with distal position achieved of the balloon guide. The diagnostic catheter and the wire were removed. Formal angiogram was performed. Road map function was used once the occluded vessel was identified. Copious back flush was performed and the balloon catheter was attached to heparinized and pressurized saline bag for forward flow. A second coaxial system was then advanced through the balloon catheter, which included the selected intermediate catheter, microcatheter, and microwire. In this scenario, the set up included a 137cm zoom 71 intermediate catheter, a Trevo Pro18 microcatheter, and 014 synchro soft wire. This system was advanced through the balloon guide catheter under the road-map function, with adequate back-flush at the rotating hemostatic valve at that back end of the balloon guide. Microcatheter and the intermediate catheter system were advanced through  the terminal ICA and MCA to the level of the occlusion. The micro wire was then carefully advanced up to the occluded segment. Intermediate catheter was then advanced on the microcatheter and wire combination, up to the proximal face of the thrombus. The microcatheter and microwire were then gently removed and the rotating hemostatic valve was removed from the intermediate catheter. Direct aspiration was then performed through the zoom catheter. Zoom catheter was then gently withdrawn with full aspiration, while manipulating the balloon guide more proximally within the cervical artery. Zoom catheter was slowly withdrawn from the balloon guide. Angiogram was performed, confirming complete restoration of flow. Angiogram of the cervical ICA was performed. Balloon guide was then removed. The skin at the puncture site was then cleaned with Chlorhexidine. The 8 French sheath was removed and an 63F angioseal was deployed. Flat panel CT was performed. Patient was extubated once the CT was reviewed. Patient tolerated the procedure well and remained hemodynamically stable throughout. No complications were encountered and no significant blood loss encountered. IMPRESSION: Status post ultrasound guided access right common femoral artery for left-sided cervical/cerebral angiogram and mechanical thrombectomy of left distal M1 occlusion using direct aspiration technique and achieving complete reperfusion of the occluded vessel. Angio-Seal for hemostasis. PLAN: The patient will remain intubated, given his comorbidities ICU status Target systolic blood pressure of 120-140 Right hip straight time 6 hours Frequent neurovascular checks Repeat neurologic imaging with CT and/MRI at the discretion of neurology team Electronically Signed   By: Gilmer Mor D.O.   On: 09/07/2020 10:18   DG CHEST PORT 1 VIEW  Result Date: 09/07/2020 CLINICAL DATA:  Respiratory distress. EXAM: PORTABLE CHEST 1 VIEW COMPARISON:  10/03/20 FINDINGS: ET  tube tip is above the carina. Stable cardiomediastinal contours. Diffuse pulmonary vascular congestion. Interval worsening aeration to the left midlung and left base which may represent airspace disease and or pleural effusion. IMPRESSION: Interval worsening aeration  to the left midlung and left base which may represent airspace disease and/or pleural effusion. Electronically Signed   By: Signa Kell M.D.   On: 09/07/2020 10:48   ECHOCARDIOGRAM COMPLETE  Result Date: 09/07/2020    ECHOCARDIOGRAM REPORT   Patient Name:   Nori Poland. Date of Exam: 09/07/2020 Medical Rec #:  [id number]          Height: Accession #:    [id number]         Weight:       275.6 lb Date of Birth:  [birth year]         BSA:          2.500 m Patient Age:    63 years           BP:           122/62 mmHg Patient Gender: M                  HR:           120 bpm. Exam Location:  Inpatient Procedure: Intracardiac Opacification Agent and 2D Echo Indications:    Stroke 434.91 / I163.9  History:        Patient has no prior history of Echocardiogram examinations.                 CHF, Stroke; Risk Factors:Hypertension and Diabetes. Acute                 ischemic left MCA stroke , Acute Respiratory Failure.  Sonographer:    Jeryl Columbia Referring Phys: [id number] SUSHANTH R AROOR IMPRESSIONS  1. Left ventricular ejection fraction, by estimation, is 30 to 35%. The left ventricle has moderately decreased function. The left ventricle demonstrates regional wall motion abnormalities (see scoring diagram/findings for description). The left ventricular internal cavity size was mildly dilated. There is moderate left ventricular hypertrophy. Left ventricular diastolic parameters are indeterminate.  2. Right ventricular systolic function is normal. The right ventricular size is normal.  3. The mitral valve is degenerative. Mild mitral valve regurgitation. No evidence of mitral stenosis. Moderate mitral annular calcification.  4. The aortic valve is  tricuspid. Aortic valve regurgitation is not visualized. Mild to moderate aortic valve sclerosis/calcification is present, without any evidence of aortic stenosis.  5. The inferior vena cava is dilated in size with >50% respiratory variability, suggesting right atrial pressure of 8 mmHg. Conclusion(s)/Recommendation(s): No intracardiac source of embolism detected on this transthoracic study. A transesophageal echocardiogram is recommended to exclude cardiac source of embolism if clinically indicated. FINDINGS  Left Ventricle: Left ventricular ejection fraction, by estimation, is 30 to 35%. The left ventricle has moderately decreased function. The left ventricle demonstrates regional wall motion abnormalities. The left ventricular internal cavity size was mildly dilated. There is moderate left ventricular hypertrophy. Left ventricular diastolic parameters are indeterminate.  LV Wall Scoring: The mid and distal anterior septum, entire apex, and mid and distal inferior wall are akinetic. Right Ventricle: The right ventricular size is normal. No increase in right ventricular wall thickness. Right ventricular systolic function is normal. Left Atrium: Left atrial size was normal in size. Right Atrium: Right atrial size was normal in size. Pericardium: There is no evidence of pericardial effusion. Mitral Valve: The mitral valve is degenerative in appearance. Moderate mitral annular calcification. Mild mitral valve regurgitation. No evidence of mitral valve stenosis. Tricuspid Valve: The tricuspid valve is normal in structure. Tricuspid valve regurgitation is not demonstrated. No evidence of tricuspid stenosis.  Aortic Valve: The aortic valve is tricuspid. Aortic valve regurgitation is not visualized. Mild to moderate aortic valve sclerosis/calcification is present, without any evidence of aortic stenosis. Pulmonic Valve: The pulmonic valve was normal in structure. Pulmonic valve regurgitation is not visualized. No evidence  of pulmonic stenosis. Aorta: The aortic root is normal in size and structure. Venous: The inferior vena cava is dilated in size with greater than 50% respiratory variability, suggesting right atrial pressure of 8 mmHg. IAS/Shunts: No atrial level shunt detected by color flow Doppler.  LEFT VENTRICLE PLAX 2D LVIDd:         5.70 cm  Diastology LVIDs:         4.20 cm  LV e' medial:    15.00 cm/s LV PW:         1.40 cm  LV E/e' medial:  7.5 LV IVS:        1.40 cm  LV e' lateral:   14.10 cm/s LVOT diam:     2.00 cm  LV E/e' lateral: 7.9 LVOT Area:     3.14 cm  RIGHT VENTRICLE RV S prime:     21.90 cm/s TAPSE (M-mode): 2.4 cm LEFT ATRIUM             Index       RIGHT ATRIUM           Index LA diam:        4.00 cm 1.60 cm/m  RA Area:     16.20 cm LA Vol (A2C):   68.8 ml 27.52 ml/m RA Volume:   44.70 ml  17.88 ml/m LA Vol (A4C):   95.9 ml 38.35 ml/m LA Biplane Vol: 85.2 ml 34.07 ml/m   AORTA Ao Root diam: 3.80 cm MITRAL VALVE MV Area (PHT): 5.97 cm     SHUNTS MV Decel Time: 127 msec     Systemic Diam: 2.00 cm MR Peak grad: 47.9 mmHg MR Vmax:      346.00 cm/s MV E velocity: 112.00 cm/s Donato Schultz MD Electronically signed by Donato Schultz MD Signature Date/Time: 09/07/2020/1:40:29 PM    Final    IR PERCUTANEOUS ART THROMBECTOMY/INFUSION INTRACRANIAL INC DIAG ANGIO  Result Date: 09/07/2020 INDICATION: 63-year-ol male presents for mechanical thrombectomy of left emergent large vessel occlusion involving the left M1 EXAM: ULTRASOUND-GUIDED ACCESS RIGHT COMMON FEMORAL ARTERY CERVICAL AND CEREBRAL ANGIOGRAM MECHANICAL THROMBECTOMY LEFT MCA ANGIO-SEAL FOR HEMOSTASIS COMPARISON:  CT imaging same day MEDICATIONS: None ANESTHESIA/SEDATION: The anesthesia team was present to provide general endotracheal tube anesthesia and for patient monitoring during the procedure. Intubation was performed in negative pressure Bay in neuro IR holding. Left radial arterial line was performed by the anesthesia team. Interventional neuro  radiology nursing staff was also present. CONTRAST:  60 cc contrast FLUOROSCOPY TIME:  Fluoroscopy Time: 8 minutes 48 seconds (778 mGy). COMPLICATIONS: None TECHNIQUE: Informed written consent was obtained from the patient's family after a thorough discussion of the procedural risks, benefits and alternatives. Specific risks discussed include: Bleeding, infection, contrast reaction, kidney injury/failure, need for further procedure/surgery, arterial injury or dissection, embolization to new territory, intracranial hemorrhage (10-15% risk), neurologic deterioration, cardiopulmonary collapse, death. All questions were addressed. Maximal Sterile Barrier Technique was utilized including during the procedure including caps, mask, sterile gowns, sterile gloves, sterile drape, hand hygiene and skin antiseptic. A timeout was performed prior to the initiation of the procedure. The anesthesia team was present to provide general endotracheal tube anesthesia and for patient monitoring during the procedure. Interventional neuro radiology nursing staff  was also present. FINDINGS: Initial Findings: Left common carotid artery:  Normal course caliber and contour. Left external carotid artery: Patent with antegrade flow. Left internal carotid artery: Normal course caliber and contour of the cervical portion. Vertical and petrous segment patent with normal course caliber contour. Cavernous segment patent. Clinoid segment patent. Antegrade flow of the ophthalmic artery. Ophthalmic segment patent. Terminus patent. Left MCA: No significant atherosclerotic changes are narrowing of the left MCA. There is a fairly early division/origin of the MCA, with an upgoing branch just beyond the lenticulostriate arteries, superior division. There is a orbital frontal or frontal polar branch in a downgoing configuration. The dominant branch is the inferior division, which is apparently the dominant division given its supply to the parietal territory  after reperfusion. Of note, there is an M4 occlusion of the early superior branch, which remains occluded throughout. Left ACA: A 1 segment patent. A 2 segment perfuses the right territory. Significant p.o. collaterals are identified, attempting to reperfuse the parietal territory and the temporal territory from the ACA distribution in the watershed area. Completion Findings: Left MCA: Complete reperfusion of the dominant inferior division of the M1 with no embolization new territory. Flat panel CT: No hemorrhage. There is stagnant focus of contrast associated with the M4 occlusion, within a separate arterial territory than the treated M1 segment. PROCEDURE: The anesthesia team was present to provide general endotracheal tube anesthesia and for patient monitoring during the procedure. Intubation was performed in negative pressure Bay in neuro IR holding. Interventional neuro radiology nursing staff was also present. Ultrasound survey of the right inguinal region was performed with images stored and sent to PACs. 11 blade scalpel was used to make a small incision. Blunt dissection was performed with US guidance. A micropuncture needle was used access the right common femoral artery under ultrasound. With excellent arterial blood flow returned, an .018 micro wire was passed through the needle, observed to enter the abdominal aorta under fluoroscopy. The needle was removed, and a micropuncture sheath was placed over the wire. The inner dilator and wire were removed, and an 035 wire was advanced under fluoroscopy into the abdominal aorta. The sheath was removed and a 25cm 75F straight vascular sheath was placed. The dilator was removed and the sheath was flushed. Sheath was attached to pressurized and heparinized saline bag for constant forward flow. A coaxial system was then advanced over the 035 wire. This included a 95cm 087 "Walrus" balloon guide with coaxial 125cm Berenstein diagnostic catheter. This was advanced to  the proximal descending thoracic aorta. Wire was then removed. Double flush of the catheter was performed. Catheter was then used to select the left common carotid artery. Angiogram was performed. Using roadmap technique, the catheter was advanced over a standard glide wire into left cervical ICA, with distal position achieved of the balloon guide. The diagnostic catheter and the wire were removed. Formal angiogram was performed. Road map function was used once the occluded vessel was identified. Copious back flush was performed and the balloon catheter was attached to heparinized and pressurized saline bag for forward flow. A second coaxial system was then advanced through the balloon catheter, which included the selected intermediate catheter, microcatheter, and microwire. In this scenario, the set up included a 137cm zoom 71 intermediate catheter, a Trevo Pro18 microcatheter, and 014 synchro soft wire. This system was advanced through the balloon guide catheter under the road-map function, with adequate back-flush at the rotating hemostatic valve at that back end of the balloon guide.  Microcatheter and the intermediate catheter system were advanced through the terminal ICA and MCA to the level of the occlusion. The micro wire was then carefully advanced up to the occluded segment. Intermediate catheter was then advanced on the microcatheter and wire combination, up to the proximal face of the thrombus. The microcatheter and microwire were then gently removed and the rotating hemostatic valve was removed from the intermediate catheter. Direct aspiration was then performed through the zoom catheter. Zoom catheter was then gently withdrawn with full aspiration, while manipulating the balloon guide more proximally within the cervical artery. Zoom catheter was slowly withdrawn from the balloon guide. Angiogram was performed, confirming complete restoration of flow. Angiogram of the cervical ICA was performed. Balloon  guide was then removed. The skin at the puncture site was then cleaned with Chlorhexidine. The 8 French sheath was removed and an 24F angioseal was deployed. Flat panel CT was performed. Patient was extubated once the CT was reviewed. Patient tolerated the procedure well and remained hemodynamically stable throughout. No complications were encountered and no significant blood loss encountered. IMPRESSION: Status post ultrasound guided access right common femoral artery for left-sided cervical/cerebral angiogram and mechanical thrombectomy of left distal M1 occlusion using direct aspiration technique and achieving complete reperfusion of the occluded vessel. Angio-Seal for hemostasis. PLAN: The patient will remain intubated, given his comorbidities ICU status Target systolic blood pressure of 120-140 Right hip straight time 6 hours Frequent neurovascular checks Repeat neurologic imaging with CT and/MRI at the discretion of neurology team Electronically Signed   By: Gilmer Mor D.O.   On: 09/07/2020 10:18    PHYSICAL EXAM  Temp:  [99.1 F (37.3 C)-100.4 F (38 C)] 100 F (37.8 C) (09/19 0900) Pulse Rate:  [79-124] 92 (09/19 0900) Resp:  [15-34] 34 (09/19 0900) BP: (90-133)/(58-88) 115/58 (09/19 0900) SpO2:  [91 %-100 %] 100 % (09/19 0900) Arterial Line BP: (115)/(54) 115/54 (09/18 1300) FiO2 (%):  [40 %] 40 % (09/19 0800)  General - Well nourished, well developed, intubated on low dose propofol.  Ophthalmologic - fundi not visualized due to noncooperation.  Cardiovascular - Regular rate and rhythm.  Neuro - intubated on low dose propofol, eyes open, but not following commands. With eye opening, eyes in mid position, however, able to have bilateral gaze with prompt. Inconsistently blinking to visual threat bilaterally, doll's eyes present, PERRL. Corneal reflex present, gag and cough present. Breathing over the vent on weaning trial.  Facial symmetry not able to test due to ET tube.  Tongue  protrusion not cooperative. Spontaneously moving LUE and LLE against gravity. On pain stimulation, RUE flaccid and RLE slight withdraw not against gravity. DTR 1+ and no babinski. Sensation, coordination and gait not tested.   ASSESSMENT/PLAN Mr. Terry Howell. is a 64 y.o. male with history of hypertension, hyperlipidemia, uncontrolled insulin-dependent diabetes mellitus, history of coronary artery disease status post PCI, heart failure presented to the ED at [facility] with sudden onset aphasia and right-sided weakness. Tele-neurology consult -> IV tPA at [facility]. Tx'd to [facility] for IR intervention. 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 Elsewhere there are a few scattered punctate and occasionally patchy infarcts in the Left MCA territory.  MRA head - Left MCA patent  2D Echo EF 30-35%  EEG no seizure  [employer] Corona Virus 2 - neg OSH  LDL - 50  HgbA1c - 8.7  UDS - neg  VTE prophylaxis - SCDs  No antithrombotic prior to admission, now on ASA 300 PR given no access  Ongoing aggressive stroke risk factor management  Therapy recommendations:  pending  Disposition:  Pending  Respiratory failure  Intubated for procedure  Continue to be intubated postprocedure  Failed the weaning trials x 2  CCM on board  On diuresis  Hope to extubate today or tomorrow  Cardiomyopathy CHF Hx of CAD s/p stent 17 years ago  Has not followed with cardiology  TTE EF 30-35%  Could be related to current stroke  On lasix 20 Q12h  Will consult cardiology  Hx of  hypertension Hypotension  Home BP meds: none   Current BP meds: none   BP soft off Neo now  On diuresis per CCM . Long-term BP goal normotensive  Hyperlipidemia  Home Lipid lowering medication: none   LDL 50, goal < 70  Consider statin once po access   Continue statin at discharge  Diabetes  Home diabetic meds: none   HgbA1c 8.7, goal < 7.0  SSI  CBG monitoring  hyperglycemia  Close PCP follow up for better DM control  Leukocytosis and fever  WBC 19.4->20.6   Tmax 100.8  UA pending  CXR pending  Dysphagia   Intubated now  No OG tube  On diuresis  Consider cortrak in am  Speech to follow after extubation  Other Stroke Risk Factors  Advanced age  Coronary artery disease  Congestive Heart Failure  Other Active Problems  Code status - DNR  Anemia - Hgb - 10.5->10.4   Hospital day # 2  This patient is critically ill due to left MCA stroke status post TPA and thrombectomy, remained intubation for respiratory failure, hyperglycemia CHF with low EF, leukocytosis and fever and at significant risk of neurological worsening, death form recurrent stroke, hemorrhagic conversion, seizure, sepsis, heart failure. This patient's care requires constant monitoring of vital signs, hemodynamics, respiratory and cardiac monitoring, review of multiple databases, neurological assessment, discussion with family, other specialists and medical decision making of high complexity. I spent 35 minutes of neurocritical care time in the care of this patient. I had long discussion with wife over the phone, updated pt current condition, treatment plan and potential prognosis, and answered all the questions. She expressed understanding and appreciation.   Per wife, pt is DNR and pt made clear before that he will not be kept on life support if things go wrong. He takes his DNR with him going to his PCP to make sure everybody knows it. He did not even want to come to ER this  time and wife had to call EMS but wife stated that if pt condition getting worse, she needs to be notified and she does not want heroic measures to keep him alive with life support as pt wanted "to die peacefully". I have updated the chart to reflect DNR.  Marvel PlanJindong Jaryan Chicoine, MD PhD Stroke Neurology 09/08/2020 1:20 PM    To contact Stroke Continuity provider, please refer to [email or web address]. After hours, contact General Neurology

## 2020-09-08 NOTE — Progress Notes (Signed)
PT Cancellation Note  Patient Details Name: Terry Howell. MRN: [id number] DOB: May 08, 1956   Cancelled Treatment:    Reason Eval/Treat Not Completed: Patient not medically ready Pt continues to be on bedrest and intubated. Will await increase in activity orders prior to PT evaluation. Will follow.   [name] A [name] 09/08/2020, 6:39 AM [place], PT, DPT Acute Rehabilitation Services Pager [phone] Office [phone]

## 2020-09-08 NOTE — Consult Note (Signed)
Cardiology Consultation:   Patient ID: Terry Howell. MRN: [id number]; DOB: October 11, 1956  Admit date: 09/16/2020 Date of Consult: 09/08/2020  Primary Care Provider: Greer Ee, MD [facility] Cardiologist: [place] Electrophysiologist:  None  PCP: Dr Helene Kelp in [place]  Patient Profile:   Terry Flater. is a 64 y.o. male with a hx of CAD, DM2, HLwho is being seen today for the evaluation of volume overload at the request of Dr Erlinda Hong.  History of Present Illness:   Terry Howell 64 yo male history of HTN, HL, DM2, CAD with prior PCI (unclear details), CHF initially presented to [facility] with sudden aphasia and right sided weakness  Received tpa, CTA showed left M1 occlusion. Sent to [facility] for mechanical thrombectomy. Admission complicated by respiratory failure requiring intbuation, vent being managed by critical care. Difficultly weaning vent, cardiology consulted to help manage HF.   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 She states at that time told his heart was only working at 40%.    K 3.8 BUN 31 WBC 20.6 Hgb 10.4 Plt 185  CXR pulm congestion EKG pending Echo LVEF 30-35%, indet DDx, normal RV function, mild MR    History reviewed. No pertinent past medical history.  Past Surgical History:  Procedure Laterality Date  . IR CT HEAD LTD  09/14/2020  . IR PERCUTANEOUS ART THROMBECTOMY/INFUSION INTRACRANIAL INC DIAG ANGIO  09/14/2020  . IR US GUIDE VASC ACCESS RIGHT  09/08/2020     Inpatient Medications: Scheduled Meds: .  stroke: mapping our early stages of recovery book   Does not apply Once  . aspirin  300 mg Rectal Daily  . chlorhexidine gluconate (MEDLINE KIT)  15 mL Mouth Rinse BID  . Chlorhexidine Gluconate Cloth  6 each Topical Daily  . feeding supplement (PROSource TF)  45 mL Per Tube BID  . feeding supplement (VITAL HIGH PROTEIN)  1,000 mL Per Tube Q24H  . influenza vac  split quadrivalent PF  0.5 mL Intramuscular Tomorrow-1000  . insulin aspart  0-15 Units Subcutaneous Q4H  . mouth rinse  15 mL Mouth Rinse 10 times per day  . pantoprazole (PROTONIX) IV  40 mg Intravenous QHS  . pneumococcal 23 valent vaccine  0.5 mL Intramuscular Tomorrow-1000   Continuous Infusions: . propofol (DIPRIVAN) infusion 30 mcg/kg/min (09/08/20 1300)   PRN Meds: acetaminophen **OR** acetaminophen (TYLENOL) oral liquid 160 mg/5 mL **OR** acetaminophen, fentaNYL (SUBLIMAZE) injection, senna-docusate  Allergies:   No Known Allergies  Social History:   Social History   Socioeconomic History  . Marital status: Married    Spouse name: Not on file  . Number of children: Not on file  . Years of education: Not on file  . Highest education level: Not on file  Occupational History  . Not on file  Tobacco Use  . Smoking status: Not on file  Substance and Sexual Activity  . Alcohol use: Not on file  . Drug use: Not on file  . Sexual activity: Not on file  Other Topics Concern  . Not on file  Social History Narrative  . Not on file   Social Determinants of Health   Financial Resource Strain:   . Difficulty of Paying Living Expenses: Not on file  Food Insecurity:   . Worried About Charity fundraiser in the Last Year: Not on file  . Ran Out of Food in the Last Year:  Not on file  Transportation Needs:   . Lack of Transportation (Medical): Not on file  . Lack of Transportation (Non-Medical): Not on file  Physical Activity:   . Days of Exercise per Week: Not on file  . Minutes of Exercise per Session: Not on file  Stress:   . Feeling of Stress : Not on file  Social Connections:   . Frequency of Communication with Friends and Family: Not on file  . Frequency of Social Gatherings with Friends and Family: Not on file  . Attends Religious Services: Not on file  . Active Member of Clubs or Organizations: Not on file  . Attends Archivist Meetings: Not on file  .  Marital Status: Not on file  Intimate Partner Violence:   . Fear of Current or Ex-Partner: Not on file  . Emotionally Abused: Not on file  . Physically Abused: Not on file  . Sexually Abused: Not on file    Family History:   History reviewed. No pertinent family history.   ROS:  Please see the history of present illness.  All other ROS reviewed and negative.     Physical Exam/Data:   Vitals:   09/08/20 1000 09/08/20 1100 09/08/20 1200 09/08/20 1300  BP: 111/61 106/64 106/72 (!) 99/57  Pulse: 91 94 86 90  Resp: (!) 34 (!) $Remo'29 20 17  'bCYRD$ Temp: 100.2 F (37.9 C) 99.9 F (37.7 C) (!) 100.8 F (38.2 C) (!) 100.6 F (38.1 C)  TempSrc:   Bladder   SpO2: 100% 100% 100% 100%  Weight:        Intake/Output Summary (Last 24 hours) at 09/08/2020 1329 Last data filed at 09/08/2020 1300 Gross per 24 hour  Intake 1888.04 ml  Output 1625 ml  Net 263.04 ml   Last 3 Weights 09/05/2020  Weight (lbs) 275 lb 9.2 oz  Weight (kg) 125 kg     There is no height or weight on file to calculate BMI.  General:  Well nourished, well developed, in no acute distress HEENT: normal Lymph: no adenopathy Neck: no JVD Endocrine:  No thryomegaly Vascular: No carotid bruits; FA pulses 2+ bilaterally without bruits  Cardiac:  normal S1, S2; RRR; no murmur  Lungs:  clear to auscultation bilaterally, no wheezing, rhonchi or rales  Abd: soft, nontender, no hepatomegaly  Ext: no edema Musculoskeletal:  No deformities, BUE and BLE strength normal and equal Skin: warm and dry  Neuro:  Cannot assess, sedated Psych:  Sedated    Laboratory Data:  High Sensitivity Troponin:  No results for input(s): TROPONINIHS in the last 720 hours.   Chemistry Recent Labs  Lab 09/07/20 0336 09/07/20 0935 09/08/20 0706  NA 142 141 141  K 3.7 3.7 3.8  CL  --  107 108  CO2  --  22 20*  GLUCOSE  --  162* 213*  BUN  --  34* 31*  CREATININE  --  1.11 1.10  CALCIUM  --  8.4* 8.1*  GFRNONAA  --  >60 >60  GFRAA  --  >60  >60  ANIONGAP  --  12 13    No results for input(s): PROT, ALBUMIN, AST, ALT, ALKPHOS, BILITOT in the last 168 hours. Hematology Recent Labs  Lab 09/07/20 0336 09/07/20 0935 09/08/20 0706  WBC  --  19.4* 20.6*  RBC  --  3.32* 3.17*  HGB 10.2* 10.5* 10.4*  HCT 30.0* 33.5* 32.9*  MCV  --  100.9* 103.8*  MCH  --  31.6 32.8  MCHC  --  31.3 31.6  RDW  --  14.7 14.6  PLT  --  197 185   BNPNo results for input(s): BNP, PROBNP in the last 168 hours.  DDimer No results for input(s): DDIMER in the last 168 hours.   Radiology/Studies:  MR ANGIO HEAD WO CONTRAST  Result Date: 09/07/2020 CLINICAL DATA:  64 year old male code stroke presentation yesterday with left MCA ELVO at [facility]. Status post IV tPA and endovascular revascularization. EXAM: MRI HEAD WITHOUT CONTRAST MRA HEAD WITHOUT CONTRAST TECHNIQUE: Multiplanar, multiecho pulse sequences of the brain and surrounding structures were obtained without intravenous contrast. Angiographic images of the head were obtained using MRA technique without contrast. COMPARISON:  [facility] CT head and CTA head and neck yesterday. FINDINGS: MRI HEAD FINDINGS Brain: Patchy restricted diffusion and petechial hemorrhage in the left basal ganglia which are mildly expanded (series 6, image 85, series 14, image 32). Heidelberg Classification type 1c vs. type 2 petechial blood. Mild additional mostly punctate but occasional patchy scattered restricted diffusion in the superior left temporal lobe, posterior left insula. No additional blood products. No contralateral or posterior fossa restricted diffusion. No midline shift, ventriculomegaly, extra-axial collection. Outside of the acute findings the gray and white matter signal is largely normal for age. Cervicomedullary junction and pituitary are within normal limits. Vascular: Major intracranial vascular flow voids are preserved. Skull and upper cervical spine: Negative for age visible cervical  spine. Visualized bone marrow signal is within normal limits. Sinuses/Orbits: Postoperative changes to both globes. Minor ethmoid and sphenoid sinus disease. Other: Intubated. Small volume of fluid layering in the pharynx. Mastoids are clear. Visible internal auditory structures appear normal. MRA HEAD FINDINGS Antegrade flow in the posterior circulation with mildly dominant left vertebral artery. No distal vertebral or basilar stenosis. Patent PICA origins, AICA, SCA and PCA origins. Posterior communicating arteries are diminutive or absent. Bilateral PCA branches are within normal limits. Antegrade flow in both ICA siphons. No siphon stenosis. Normal ophthalmic artery origins. Patent carotid termini. Patent MCA and ACA origins. Anterior communicating artery, visible ACA branches, right MCA M1 and visible right MCA branches are within normal limits. Patent left MCA M1 and bifurcation now. No significant vessel irregularity. Visible left MCA branches are within normal limits. IMPRESSION: 1. Left lenticulostriate territory infarcts with patchy petechial hemorrhage and mildly expanded left basal ganglia. No significant intracranial mass effect. Elsewhere there are a few scattered punctate and occasionally patchy infarcts in the Left MCA territory. 2. Normal Left MCA and negative intracranial MRA. 3. Outside of the left MCA territory negative for age MRI appearance of the brain. Electronically Signed   By: Genevie Ann M.D.   On: 09/07/2020 19:22   MR BRAIN WO CONTRAST  Result Date: 09/07/2020 CLINICAL DATA:  64 year old male code stroke presentation yesterday with left MCA ELVO at [facility]. Status post IV tPA and endovascular revascularization. EXAM: MRI HEAD WITHOUT CONTRAST MRA HEAD WITHOUT CONTRAST TECHNIQUE: Multiplanar, multiecho pulse sequences of the brain and surrounding structures were obtained without intravenous contrast. Angiographic images of the head were obtained using MRA technique without  contrast. COMPARISON:  [facility] CT head and CTA head and neck yesterday. FINDINGS: MRI HEAD FINDINGS Brain: Patchy restricted diffusion and petechial hemorrhage in the left basal ganglia which are mildly expanded (series 6, image 85, series 14, image 32). Heidelberg Classification type 1c vs. type 2 petechial blood. Mild additional mostly punctate but occasional patchy scattered restricted diffusion in the superior left temporal lobe, posterior left insula.  No additional blood products. No contralateral or posterior fossa restricted diffusion. No midline shift, ventriculomegaly, extra-axial collection. Outside of the acute findings the gray and white matter signal is largely normal for age. Cervicomedullary junction and pituitary are within normal limits. Vascular: Major intracranial vascular flow voids are preserved. Skull and upper cervical spine: Negative for age visible cervical spine. Visualized bone marrow signal is within normal limits. Sinuses/Orbits: Postoperative changes to both globes. Minor ethmoid and sphenoid sinus disease. Other: Intubated. Small volume of fluid layering in the pharynx. Mastoids are clear. Visible internal auditory structures appear normal. MRA HEAD FINDINGS Antegrade flow in the posterior circulation with mildly dominant left vertebral artery. No distal vertebral or basilar stenosis. Patent PICA origins, AICA, SCA and PCA origins. Posterior communicating arteries are diminutive or absent. Bilateral PCA branches are within normal limits. Antegrade flow in both ICA siphons. No siphon stenosis. Normal ophthalmic artery origins. Patent carotid termini. Patent MCA and ACA origins. Anterior communicating artery, visible ACA branches, right MCA M1 and visible right MCA branches are within normal limits. Patent left MCA M1 and bifurcation now. No significant vessel irregularity. Visible left MCA branches are within normal limits. IMPRESSION: 1. Left lenticulostriate territory  infarcts with patchy petechial hemorrhage and mildly expanded left basal ganglia. No significant intracranial mass effect. Elsewhere there are a few scattered punctate and occasionally patchy infarcts in the Left MCA territory. 2. Normal Left MCA and negative intracranial MRA. 3. Outside of the left MCA territory negative for age MRI appearance of the brain. Electronically Signed   By: Genevie Ann M.D.   On: 09/07/2020 19:22   IR CT Head Ltd  Result Date: 09/07/2020 INDICATION: 2-year-ol male presents for mechanical thrombectomy of left emergent large vessel occlusion involving the left M1 EXAM: ULTRASOUND-GUIDED ACCESS RIGHT COMMON FEMORAL ARTERY CERVICAL AND CEREBRAL ANGIOGRAM MECHANICAL THROMBECTOMY LEFT MCA ANGIO-SEAL FOR HEMOSTASIS COMPARISON:  CT imaging same day MEDICATIONS: None ANESTHESIA/SEDATION: The anesthesia team was present to provide general endotracheal tube anesthesia and for patient monitoring during the procedure. Intubation was performed in negative pressure Bay in neuro IR holding. Left radial arterial line was performed by the anesthesia team. Interventional neuro radiology nursing staff was also present. CONTRAST:  60 cc contrast FLUOROSCOPY TIME:  Fluoroscopy Time: 8 minutes 48 seconds (778 mGy). COMPLICATIONS: None TECHNIQUE: Informed written consent was obtained from the patient's family after a thorough discussion of the procedural risks, benefits and alternatives. Specific risks discussed include: Bleeding, infection, contrast reaction, kidney injury/failure, need for further procedure/surgery, arterial injury or dissection, embolization to new territory, intracranial hemorrhage (10-15% risk), neurologic deterioration, cardiopulmonary collapse, death. All questions were addressed. Maximal Sterile Barrier Technique was utilized including during the procedure including caps, mask, sterile gowns, sterile gloves, sterile drape, hand hygiene and skin antiseptic. A timeout was performed prior  to the initiation of the procedure. The anesthesia team was present to provide general endotracheal tube anesthesia and for patient monitoring during the procedure. Interventional neuro radiology nursing staff was also present. FINDINGS: Initial Findings: Left common carotid artery:  Normal course caliber and contour. Left external carotid artery: Patent with antegrade flow. Left internal carotid artery: Normal course caliber and contour of the cervical portion. Vertical and petrous segment patent with normal course caliber contour. Cavernous segment patent. Clinoid segment patent. Antegrade flow of the ophthalmic artery. Ophthalmic segment patent. Terminus patent. Left MCA: No significant atherosclerotic changes are narrowing of the left MCA. There is a fairly early division/origin of the MCA, with an upgoing Orlan Aversa just beyond  the lenticulostriate arteries, superior division. There is a orbital frontal or frontal polar Hallie Ishida in a downgoing configuration. The dominant Riham Polyakov is the inferior division, which is apparently the dominant division given its supply to the parietal territory after reperfusion. Of note, there is an M4 occlusion of the early superior Nautica Hotz, which remains occluded throughout. Left ACA: A 1 segment patent. A 2 segment perfuses the right territory. Significant p.o. collaterals are identified, attempting to reperfuse the parietal territory and the temporal territory from the [place] distribution in the watershed area. Completion Findings: Left MCA: Complete reperfusion of the dominant inferior division of the M1 with no embolization new territory. Flat panel CT: No hemorrhage. There is stagnant focus of contrast associated with the M4 occlusion, within a separate arterial territory than the treated M1 segment. PROCEDURE: The anesthesia team was present to provide general endotracheal tube anesthesia and for patient monitoring during the procedure. Intubation was performed in negative pressure Bay  in neuro IR holding. Interventional neuro radiology nursing staff was also present. Ultrasound survey of the right inguinal region was performed with images stored and sent to PACs. 11 blade scalpel was used to make a small incision. Blunt dissection was performed with US guidance. A micropuncture needle was used access the right common femoral artery under ultrasound. With excellent arterial blood flow returned, an .018 micro wire was passed through the needle, observed to enter the abdominal aorta under fluoroscopy. The needle was removed, and a micropuncture sheath was placed over the wire. The inner dilator and wire were removed, and an 035 wire was advanced under fluoroscopy into the abdominal aorta. The sheath was removed and a 25cm 65F straight vascular sheath was placed. The dilator was removed and the sheath was flushed. Sheath was attached to pressurized and heparinized saline bag for constant forward flow. A coaxial system was then advanced over the 035 wire. This included a 95cm 087 "Walrus" balloon guide with coaxial 125cm Berenstein diagnostic catheter. This was advanced to the proximal descending thoracic aorta. Wire was then removed. Double flush of the catheter was performed. Catheter was then used to select the left common carotid artery. Angiogram was performed. Using roadmap technique, the catheter was advanced over a standard glide wire into left cervical ICA, with distal position achieved of the balloon guide. The diagnostic catheter and the wire were removed. Formal angiogram was performed. Road map function was used once the occluded vessel was identified. Copious back flush was performed and the balloon catheter was attached to heparinized and pressurized saline bag for forward flow. A second coaxial system was then advanced through the balloon catheter, which included the selected intermediate catheter, microcatheter, and microwire. In this scenario, the set up included a 137cm zoom 71  intermediate catheter, a Trevo Pro18 microcatheter, and 014 synchro soft wire. This system was advanced through the balloon guide catheter under the road-map function, with adequate back-flush at the rotating hemostatic valve at that back end of the balloon guide. Microcatheter and the intermediate catheter system were advanced through the terminal ICA and MCA to the level of the occlusion. The micro wire was then carefully advanced up to the occluded segment. Intermediate catheter was then advanced on the microcatheter and wire combination, up to the proximal face of the thrombus. The microcatheter and microwire were then gently removed and the rotating hemostatic valve was removed from the intermediate catheter. Direct aspiration was then performed through the zoom catheter. Zoom catheter was then gently withdrawn with full aspiration, while manipulating the  balloon guide more proximally within the cervical artery. Zoom catheter was slowly withdrawn from the balloon guide. Angiogram was performed, confirming complete restoration of flow. Angiogram of the cervical ICA was performed. Balloon guide was then removed. The skin at the puncture site was then cleaned with Chlorhexidine. The 8 French sheath was removed and an 48F angioseal was deployed. Flat panel CT was performed. Patient was extubated once the CT was reviewed. Patient tolerated the procedure well and remained hemodynamically stable throughout. No complications were encountered and no significant blood loss encountered. IMPRESSION: Status post ultrasound guided access right common femoral artery for left-sided cervical/cerebral angiogram and mechanical thrombectomy of left distal M1 occlusion using direct aspiration technique and achieving complete reperfusion of the occluded vessel. Angio-Seal for hemostasis. PLAN: The patient will remain intubated, given his comorbidities ICU status Target systolic blood pressure of 120-140 Right hip straight time 6  hours Frequent neurovascular checks Repeat neurologic imaging with CT and/MRI at the discretion of neurology team Electronically Signed   By: Corrie Mckusick D.O.   On: 09/07/2020 10:18   IR US Guide Vasc Access Right  Result Date: 09/07/2020 INDICATION: 45-year-ol male presents for mechanical thrombectomy of left emergent large vessel occlusion involving the left M1 EXAM: ULTRASOUND-GUIDED ACCESS RIGHT COMMON FEMORAL ARTERY CERVICAL AND CEREBRAL ANGIOGRAM MECHANICAL THROMBECTOMY LEFT MCA ANGIO-SEAL FOR HEMOSTASIS COMPARISON:  CT imaging same day MEDICATIONS: None ANESTHESIA/SEDATION: The anesthesia team was present to provide general endotracheal tube anesthesia and for patient monitoring during the procedure. Intubation was performed in negative pressure Bay in neuro IR holding. Left radial arterial line was performed by the anesthesia team. Interventional neuro radiology nursing staff was also present. CONTRAST:  60 cc contrast FLUOROSCOPY TIME:  Fluoroscopy Time: 8 minutes 48 seconds (778 mGy). COMPLICATIONS: None TECHNIQUE: Informed written consent was obtained from the patient's family after a thorough discussion of the procedural risks, benefits and alternatives. Specific risks discussed include: Bleeding, infection, contrast reaction, kidney injury/failure, need for further procedure/surgery, arterial injury or dissection, embolization to new territory, intracranial hemorrhage (10-15% risk), neurologic deterioration, cardiopulmonary collapse, death. All questions were addressed. Maximal Sterile Barrier Technique was utilized including during the procedure including caps, mask, sterile gowns, sterile gloves, sterile drape, hand hygiene and skin antiseptic. A timeout was performed prior to the initiation of the procedure. The anesthesia team was present to provide general endotracheal tube anesthesia and for patient monitoring during the procedure. Interventional neuro radiology nursing staff was also  present. FINDINGS: Initial Findings: Left common carotid artery:  Normal course caliber and contour. Left external carotid artery: Patent with antegrade flow. Left internal carotid artery: Normal course caliber and contour of the cervical portion. Vertical and petrous segment patent with normal course caliber contour. Cavernous segment patent. Clinoid segment patent. Antegrade flow of the ophthalmic artery. Ophthalmic segment patent. Terminus patent. Left MCA: No significant atherosclerotic changes are narrowing of the left MCA. There is a fairly early division/origin of the MCA, with an upgoing Alesana Magistro just beyond the lenticulostriate arteries, superior division. There is a orbital frontal or frontal polar Renaud Celli in a downgoing configuration. The dominant Aowyn Rozeboom is the inferior division, which is apparently the dominant division given its supply to the parietal territory after reperfusion. Of note, there is an M4 occlusion of the early superior Letecia Arps, which remains occluded throughout. Left ACA: A 1 segment patent. A 2 segment perfuses the right territory. Significant p.o. collaterals are identified, attempting to reperfuse the parietal territory and the temporal territory from the [place] distribution in the  watershed area. Completion Findings: Left MCA: Complete reperfusion of the dominant inferior division of the M1 with no embolization new territory. Flat panel CT: No hemorrhage. There is stagnant focus of contrast associated with the M4 occlusion, within a separate arterial territory than the treated M1 segment. PROCEDURE: The anesthesia team was present to provide general endotracheal tube anesthesia and for patient monitoring during the procedure. Intubation was performed in negative pressure Bay in neuro IR holding. Interventional neuro radiology nursing staff was also present. Ultrasound survey of the right inguinal region was performed with images stored and sent to PACs. 11 blade scalpel was used to make a  small incision. Blunt dissection was performed with US guidance. A micropuncture needle was used access the right common femoral artery under ultrasound. With excellent arterial blood flow returned, an .018 micro wire was passed through the needle, observed to enter the abdominal aorta under fluoroscopy. The needle was removed, and a micropuncture sheath was placed over the wire. The inner dilator and wire were removed, and an 035 wire was advanced under fluoroscopy into the abdominal aorta. The sheath was removed and a 25cm 85F straight vascular sheath was placed. The dilator was removed and the sheath was flushed. Sheath was attached to pressurized and heparinized saline bag for constant forward flow. A coaxial system was then advanced over the 035 wire. This included a 95cm 087 "Walrus" balloon guide with coaxial 125cm Berenstein diagnostic catheter. This was advanced to the proximal descending thoracic aorta. Wire was then removed. Double flush of the catheter was performed. Catheter was then used to select the left common carotid artery. Angiogram was performed. Using roadmap technique, the catheter was advanced over a standard glide wire into left cervical ICA, with distal position achieved of the balloon guide. The diagnostic catheter and the wire were removed. Formal angiogram was performed. Road map function was used once the occluded vessel was identified. Copious back flush was performed and the balloon catheter was attached to heparinized and pressurized saline bag for forward flow. A second coaxial system was then advanced through the balloon catheter, which included the selected intermediate catheter, microcatheter, and microwire. In this scenario, the set up included a 137cm zoom 71 intermediate catheter, a Trevo Pro18 microcatheter, and 014 synchro soft wire. This system was advanced through the balloon guide catheter under the road-map function, with adequate back-flush at the rotating hemostatic  valve at that back end of the balloon guide. Microcatheter and the intermediate catheter system were advanced through the terminal ICA and MCA to the level of the occlusion. The micro wire was then carefully advanced up to the occluded segment. Intermediate catheter was then advanced on the microcatheter and wire combination, up to the proximal face of the thrombus. The microcatheter and microwire were then gently removed and the rotating hemostatic valve was removed from the intermediate catheter. Direct aspiration was then performed through the zoom catheter. Zoom catheter was then gently withdrawn with full aspiration, while manipulating the balloon guide more proximally within the cervical artery. Zoom catheter was slowly withdrawn from the balloon guide. Angiogram was performed, confirming complete restoration of flow. Angiogram of the cervical ICA was performed. Balloon guide was then removed. The skin at the puncture site was then cleaned with Chlorhexidine. The 8 French sheath was removed and an 85F angioseal was deployed. Flat panel CT was performed. Patient was extubated once the CT was reviewed. Patient tolerated the procedure well and remained hemodynamically stable throughout. No complications were encountered and no significant  blood loss encountered. IMPRESSION: Status post ultrasound guided access right common femoral artery for left-sided cervical/cerebral angiogram and mechanical thrombectomy of left distal M1 occlusion using direct aspiration technique and achieving complete reperfusion of the occluded vessel. Angio-Seal for hemostasis. PLAN: The patient will remain intubated, given his comorbidities ICU status Target systolic blood pressure of 120-140 Right hip straight time 6 hours Frequent neurovascular checks Repeat neurologic imaging with CT and/MRI at the discretion of neurology team Electronically Signed   By: Corrie Mckusick D.O.   On: 09/07/2020 10:18   DG CHEST PORT 1 VIEW  Result Date:  09/07/2020 CLINICAL DATA:  Respiratory distress. EXAM: PORTABLE CHEST 1 VIEW COMPARISON:  08/30/2020 FINDINGS: ET tube tip is above the carina. Stable cardiomediastinal contours. Diffuse pulmonary vascular congestion. Interval worsening aeration to the left midlung and left base which may represent airspace disease and or pleural effusion. IMPRESSION: Interval worsening aeration to the left midlung and left base which may represent airspace disease and/or pleural effusion. Electronically Signed   By: Kerby Moors M.D.   On: 09/07/2020 10:48   ECHOCARDIOGRAM COMPLETE  Result Date: 09/07/2020    ECHOCARDIOGRAM REPORT   Patient Name:   Terry Howell. Date of Exam: 09/07/2020 Medical Rec #:  [id number]          Height: Accession #:    [id number]         Weight:       275.6 lb Date of Birth:  05/06/1956         BSA:          2.500 m Patient Age:    64 years           BP:           122/62 mmHg Patient Gender: M                  HR:           120 bpm. Exam Location:  Inpatient Procedure: Intracardiac Opacification Agent and 2D Echo Indications:    Stroke 434.91 / I163.9  History:        Patient has no prior history of Echocardiogram examinations.                 CHF, Stroke; Risk Factors:Hypertension and Diabetes. Acute                 ischemic left MCA stroke , Acute Respiratory Failure.  Sonographer:    Leavy Cella Referring Phys: [id number] [place] R AROOR IMPRESSIONS  1. Left ventricular ejection fraction, by estimation, is 30 to 35%. The left ventricle has moderately decreased function. The left ventricle demonstrates regional wall motion abnormalities (see scoring diagram/findings for description). The left ventricular internal cavity size was mildly dilated. There is moderate left ventricular hypertrophy. Left ventricular diastolic parameters are indeterminate.  2. Right ventricular systolic function is normal. The right ventricular size is normal.  3. The mitral valve is degenerative. Mild mitral valve  regurgitation. No evidence of mitral stenosis. Moderate mitral annular calcification.  4. The aortic valve is tricuspid. Aortic valve regurgitation is not visualized. Mild to moderate aortic valve sclerosis/calcification is present, without any evidence of aortic stenosis.  5. The inferior vena cava is dilated in size with >50% respiratory variability, suggesting right atrial pressure of 8 mmHg. Conclusion(s)/Recommendation(s): No intracardiac source of embolism detected on this transthoracic study. A transesophageal echocardiogram is recommended to exclude cardiac source of embolism if clinically indicated. FINDINGS  Left Ventricle: Left  ventricular ejection fraction, by estimation, is 30 to 35%. The left ventricle has moderately decreased function. The left ventricle demonstrates regional wall motion abnormalities. The left ventricular internal cavity size was mildly dilated. There is moderate left ventricular hypertrophy. Left ventricular diastolic parameters are indeterminate.  LV Wall Scoring: The mid and distal anterior septum, entire apex, and mid and distal inferior wall are akinetic. Right Ventricle: The right ventricular size is normal. No increase in right ventricular wall thickness. Right ventricular systolic function is normal. Left Atrium: Left atrial size was normal in size. Right Atrium: Right atrial size was normal in size. Pericardium: There is no evidence of pericardial effusion. Mitral Valve: The mitral valve is degenerative in appearance. Moderate mitral annular calcification. Mild mitral valve regurgitation. No evidence of mitral valve stenosis. Tricuspid Valve: The tricuspid valve is normal in structure. Tricuspid valve regurgitation is not demonstrated. No evidence of tricuspid stenosis. Aortic Valve: The aortic valve is tricuspid. Aortic valve regurgitation is not visualized. Mild to moderate aortic valve sclerosis/calcification is present, without any evidence of aortic stenosis. Pulmonic  Valve: The pulmonic valve was normal in structure. Pulmonic valve regurgitation is not visualized. No evidence of pulmonic stenosis. Aorta: The aortic root is normal in size and structure. Venous: The inferior vena cava is dilated in size with greater than 50% respiratory variability, suggesting right atrial pressure of 8 mmHg. IAS/Shunts: No atrial level shunt detected by color flow Doppler.  LEFT VENTRICLE PLAX 2D LVIDd:         5.70 cm  Diastology LVIDs:         4.20 cm  LV e' medial:    15.00 cm/s LV PW:         1.40 cm  LV E/e' medial:  7.5 LV IVS:        1.40 cm  LV e' lateral:   14.10 cm/s LVOT diam:     2.00 cm  LV E/e' lateral: 7.9 LVOT Area:     3.14 cm  RIGHT VENTRICLE RV S prime:     21.90 cm/s TAPSE (M-mode): 2.4 cm LEFT ATRIUM             Index       RIGHT ATRIUM           Index LA diam:        4.00 cm 1.60 cm/m  RA Area:     16.20 cm LA Vol (A2C):   68.8 ml 27.52 ml/m RA Volume:   44.70 ml  17.88 ml/m LA Vol (A4C):   95.9 ml 38.35 ml/m LA Biplane Vol: 85.2 ml 34.07 ml/m   AORTA Ao Root diam: 3.80 cm MITRAL VALVE MV Area (PHT): 5.97 cm     SHUNTS MV Decel Time: 127 msec     Systemic Diam: 2.00 cm MR Peak grad: 47.9 mmHg MR Vmax:      346.00 cm/s MV E velocity: 112.00 cm/s Candee Furbish MD Electronically signed by Candee Furbish MD Signature Date/Time: 09/07/2020/1:40:29 PM    Final    IR PERCUTANEOUS ART THROMBECTOMY/INFUSION INTRACRANIAL INC DIAG ANGIO  Result Date: 09/07/2020 INDICATION: 25-year-ol male presents for mechanical thrombectomy of left emergent large vessel occlusion involving the left M1 EXAM: ULTRASOUND-GUIDED ACCESS RIGHT COMMON FEMORAL ARTERY CERVICAL AND CEREBRAL ANGIOGRAM MECHANICAL THROMBECTOMY LEFT MCA ANGIO-SEAL FOR HEMOSTASIS COMPARISON:  CT imaging same day MEDICATIONS: None ANESTHESIA/SEDATION: The anesthesia team was present to provide general endotracheal tube anesthesia and for patient monitoring during the procedure. Intubation was performed in negative pressure [place]  in neuro IR holding. Left radial arterial line was performed by the anesthesia team. Interventional neuro radiology nursing staff was also present. CONTRAST:  60 cc contrast FLUOROSCOPY TIME:  Fluoroscopy Time: 8 minutes 48 seconds (778 mGy). COMPLICATIONS: None TECHNIQUE: Informed written consent was obtained from the patient's family after a thorough discussion of the procedural risks, benefits and alternatives. Specific risks discussed include: Bleeding, infection, contrast reaction, kidney injury/failure, need for further procedure/surgery, arterial injury or dissection, embolization to new territory, intracranial hemorrhage (10-15% risk), neurologic deterioration, cardiopulmonary collapse, death. All questions were addressed. Maximal Sterile Barrier Technique was utilized including during the procedure including caps, mask, sterile gowns, sterile gloves, sterile drape, hand hygiene and skin antiseptic. A timeout was performed prior to the initiation of the procedure. The anesthesia team was present to provide general endotracheal tube anesthesia and for patient monitoring during the procedure. Interventional neuro radiology nursing staff was also present. FINDINGS: Initial Findings: Left common carotid artery:  Normal course caliber and contour. Left external carotid artery: Patent with antegrade flow. Left internal carotid artery: Normal course caliber and contour of the cervical portion. Vertical and petrous segment patent with normal course caliber contour. Cavernous segment patent. Clinoid segment patent. Antegrade flow of the ophthalmic artery. Ophthalmic segment patent. Terminus patent. Left MCA: No significant atherosclerotic changes are narrowing of the left MCA. There is a fairly early division/origin of the MCA, with an upgoing Willadeen Colantuono just beyond the lenticulostriate arteries, superior division. There is a orbital frontal or frontal polar Makaylen Thieme in a downgoing configuration. The dominant Freda Jaquith is the  inferior division, which is apparently the dominant division given its supply to the parietal territory after reperfusion. Of note, there is an M4 occlusion of the early superior Lawerence Dery, which remains occluded throughout. Left ACA: A 1 segment patent. A 2 segment perfuses the right territory. Significant p.o. collaterals are identified, attempting to reperfuse the parietal territory and the temporal territory from the ACA distribution in the watershed area. Completion Findings: Left MCA: Complete reperfusion of the dominant inferior division of the M1 with no embolization new territory. Flat panel CT: No hemorrhage. There is stagnant focus of contrast associated with the M4 occlusion, within a separate arterial territory than the treated M1 segment. PROCEDURE: The anesthesia team was present to provide general endotracheal tube anesthesia and for patient monitoring during the procedure. Intubation was performed in negative pressure [place] in neuro IR holding. Interventional neuro radiology nursing staff was also present. Ultrasound survey of the right inguinal region was performed with images stored and sent to PACs. 11 blade scalpel was used to make a small incision. Blunt dissection was performed with US guidance. A micropuncture needle was used access the right common femoral artery under ultrasound. With excellent arterial blood flow returned, an .018 micro wire was passed through the needle, observed to enter the abdominal aorta under fluoroscopy. The needle was removed, and a micropuncture sheath was placed over the wire. The inner dilator and wire were removed, and an 035 wire was advanced under fluoroscopy into the abdominal aorta. The sheath was removed and a 25cm 32F straight vascular sheath was placed. The dilator was removed and the sheath was flushed. Sheath was attached to pressurized and heparinized saline bag for constant forward flow. A coaxial system was then advanced over the 035 wire. This included a  95cm 087 "Walrus" balloon guide with coaxial 125cm Berenstein diagnostic catheter. This was advanced to the proximal descending thoracic aorta. Wire was then removed. Double flush of the catheter  was performed. Catheter was then used to select the left common carotid artery. Angiogram was performed. Using roadmap technique, the catheter was advanced over a standard glide wire into left cervical ICA, with distal position achieved of the balloon guide. The diagnostic catheter and the wire were removed. Formal angiogram was performed. Road map function was used once the occluded vessel was identified. Copious back flush was performed and the balloon catheter was attached to heparinized and pressurized saline bag for forward flow. A second coaxial system was then advanced through the balloon catheter, which included the selected intermediate catheter, microcatheter, and microwire. In this scenario, the set up included a 137cm zoom 71 intermediate catheter, a Trevo Pro18 microcatheter, and 014 synchro soft wire. This system was advanced through the balloon guide catheter under the road-map function, with adequate back-flush at the rotating hemostatic valve at that back end of the balloon guide. Microcatheter and the intermediate catheter system were advanced through the terminal ICA and MCA to the level of the occlusion. The micro wire was then carefully advanced up to the occluded segment. Intermediate catheter was then advanced on the microcatheter and wire combination, up to the proximal face of the thrombus. The microcatheter and microwire were then gently removed and the rotating hemostatic valve was removed from the intermediate catheter. Direct aspiration was then performed through the zoom catheter. Zoom catheter was then gently withdrawn with full aspiration, while manipulating the balloon guide more proximally within the cervical artery. Zoom catheter was slowly withdrawn from the balloon guide. Angiogram was  performed, confirming complete restoration of flow. Angiogram of the cervical ICA was performed. Balloon guide was then removed. The skin at the puncture site was then cleaned with Chlorhexidine. The 8 French sheath was removed and an 46F angioseal was deployed. Flat panel CT was performed. Patient was extubated once the CT was reviewed. Patient tolerated the procedure well and remained hemodynamically stable throughout. No complications were encountered and no significant blood loss encountered. IMPRESSION: Status post ultrasound guided access right common femoral artery for left-sided cervical/cerebral angiogram and mechanical thrombectomy of left distal M1 occlusion using direct aspiration technique and achieving complete reperfusion of the occluded vessel. Angio-Seal for hemostasis. PLAN: The patient will remain intubated, given his comorbidities ICU status Target systolic blood pressure of 120-140 Right hip straight time 6 hours Frequent neurovascular checks Repeat neurologic imaging with CT and/MRI at the discretion of neurology team Electronically Signed   By: Corrie Mckusick D.O.   On: 09/07/2020 10:18   { Assessment and Plan:   1. 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 Told pumping function was at 40% at that time. He did not follow up with cardiology other than brief period with Dr Bettina Gavia right after event. Overall would suggest his dysfunction is chronic, perhaps slightly worst from 2004.   - intubated post stroke for thrombectomy, pulmonary edema. Difficultly being able to extubate  - soft bp's but seem to be improvid. , careful diuresis - yeseterday received lasix $RemoveBefore'20mg'[id number]$  x 3 doses. Neg 315 mL. Renal function stable - would dose IV lasix $Remove'40mg'[id number]$  x 2 today, reassess diuretic dosing tomorrow (has not been scheduled) - soft bp's limit additional CHF  meds at this time - prognosis and recovery from the  the stroke would determine potential for ischemic testing, nothing  would be planned at this time. Look to obtain prior records to better undestand chronicty of dysfunction as wel.   2. Goals of care - from neuro note wife does not want extended life support - does not want heroic measures if patient worsens - currently has do not resuscitate order, continue current level of care  For questions or updates, please contact [place] Please consult [email or web address] for contact info under    Signed, Carlyle Dolly, MD  09/08/2020 1:29 PM

## 2020-09-08 NOTE — Progress Notes (Signed)
Brief Nutrition Note RD working remotely.   Consult received for enteral/tube feeding initiation and management.  Patient is intubated. No feeding tube is documented in [facility] avatar.   Adult Enteral Nutrition Protocol initiated. Full assessment to follow.  Admitting Dx: Acute ischemic left MCA stroke (HCC) [I63.512]  There is no height or weight on file to calculate BMI.   Labs:  Recent Labs  Lab 09/07/20 0336 09/07/20 0935 09/08/20 0706  NA 142 141 141  K 3.7 3.7 3.8  CL  --  107 108  CO2  --  22 20*  BUN  --  34* 31*  CREATININE  --  1.11 1.10  CALCIUM  --  8.4* 8.1*  MG  --  1.9  --   PHOS  --  3.6  --   GLUCOSE  --  162* 213*       Trenton Gammon, MS, RD, LDN, CNSC Inpatient Clinical Dietitian RD pager # available in AMION  After hours/weekend pager # available in [facility]

## 2020-09-08 NOTE — Consult Note (Addendum)
NAME:  Terry Howell, Terry Howell MRN:  [id number], DOB:  05/07/1956, LOS: 2 ADMISSION DATE:  09/15/2020, CONSULTATION DATE:  09/18 REFERRING MD: Dr Laurence Slate , CHIEF COMPLAINT: MCA CVA  Brief History   64 year old white male that presented with acute onset CVA.  History of present illness   64 year old white male who presented from home to [facility].  Was discovered on the floor by the patient's wife after falling from standing position.  Patient presented with right-sided weakness aphasia and right facial droop.  Last known normal time was at 1600 hrs. 09/17.  Patient did have a cough for approximately 1 week but is Covid negative.  TPA was administered at 2003 hrs. on 09/13/2020.  Past Medical History  Hypertension Diabetes Hyperlipidemia  Consults:  Neurology  Procedures:  Thrombectomy  Significant Diagnostic Tests:  CT head 09/17: Impression: 1.  No acute intercranial hemorrhage or cortically based infarct identified but suspicious density of the left MCA M1 segment consider emergent large vessel occlusion in the appropriate setting. 2.  Brain parenchyma appears normal for age Interval Events:  Follows commands on left, not on right. Arouses to voice. Tachypneic today on PSV  Objective   Blood pressure 106/72, pulse 86, temperature (!) 100.8 F (38.2 C), temperature source Bladder, resp. rate 20, weight 125 kg, SpO2 100 %.    Vent Mode: PRVC FiO2 (%):  [40 %] 40 % Set Rate:  [14 bmp] 14 bmp Vt Set:  [550 mL] 550 mL PEEP:  [5 cmH20] 5 cmH20 Pressure Support:  [5 cmH20-15 cmH20] 15 cmH20 Plateau Pressure:  [18 cmH20-21 cmH20] 18 cmH20   Intake/Output Summary (Last 24 hours) at 09/08/2020 1246 Last data filed at 09/08/2020 0900 Gross per 24 hour  Intake 1699.53 ml  Output 1625 ml  Net 74.53 ml   Filed Weights   08/30/2020 2315  Weight: 125 kg    Examination: General: Atraumatic HENT: Atraumatic/normocephalic.  Orally intubated.  Mucous membranes are  moist. Lungs:Mechanical sounds.  Bilateral breath sounds intact. Cardiovascular: Regular rate no rub murmur gallop appreciated. Abdomen: Soft, nondistended, positive bowel sounds, no rebound/rigidity/guarding the limited by neurologic status. Extremities: Distal pulse intact x4.  +2 pedal edema.  No acrocyanosis.  No clubbing. Neuro: RASS -4.  Chemically sedated. GU: Foley catheter intact.    Assessment & Plan:  Acute respiratory failure: Due to pulmonary edema and post operative state. Tachypneic on PSV. 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  Best practice:  Diet: Trickle feeds Pain/Anxiety/Delirium protocol (if indicated): propofol,  VAP protocol (if indicated): Initiated DVT prophylaxis: SCDs only GI prophylaxis: Protonix Glucose control: Insulin sliding scale Mobility: Bedrest Code Status: Full code Family Communication:   Disposition: ICU  Labs   CBC: Recent Labs  Lab 09/07/20 0336 09/07/20 0935 09/08/20 0706  WBC  --  19.4* 20.6*  HGB 10.2* 10.5* 10.4*  HCT 30.0* 33.5* 32.9*  MCV  --  100.9* 103.8*  PLT  --  197 185    Basic Metabolic Panel: Recent Labs  Lab 09/07/20 0336 09/07/20 0935 09/08/20 0706  NA 142 141 141  K 3.7 3.7 3.8  CL  --  107 108  CO2  --  22 20*  GLUCOSE  --  162* 213*  BUN  --  34*  31*  CREATININE  --  1.11 1.10  CALCIUM  --  8.4* 8.1*  MG  --  1.9  --   PHOS  --  3.6  --    GFR: CrCl cannot be calculated (Unknown ideal weight.). Recent Labs  Lab 09/07/20 0935 09/08/20 0706  WBC 19.4* 20.6*    Liver Function Tests: No results for input(s): AST, ALT, ALKPHOS, BILITOT, PROT, ALBUMIN in the last 168 hours. No results for input(s): LIPASE, AMYLASE in the last  168 hours. No results for input(s): AMMONIA in the last 168 hours.  ABG    Component Value Date/Time   PHART 7.428 09/07/2020 0336   PCO2ART 35.5 09/07/2020 0336   PO2ART 306 (H) 09/07/2020 0336   HCO3 23.5 09/07/2020 0336   TCO2 25 09/07/2020 0336   ACIDBASEDEF 1.0 09/07/2020 0336   O2SAT 100.0 09/07/2020 0336     Coagulation Profile: No results for input(s): INR, PROTIME in the last 168 hours.  Cardiac Enzymes: No results for input(s): CKTOTAL, CKMB, CKMBINDEX, TROPONINI in the last 168 hours.  HbA1C: Hgb A1c MFr Bld  Date/Time Value Ref Range Status  09/07/2020 09:35 AM 8.7 (H) 4.8 - 5.6 % Final    Comment:    (NOTE) Pre diabetes:          5.7%-6.4%  Diabetes:              >6.4%  Glycemic control for   <7.0% adults with diabetes     CBG: Recent Labs  Lab 09/07/20 1537 09/07/20 1934 09/07/20 2324 09/08/20 0339 09/08/20 0754  GLUCAP 174* 161* 196* 192* 214*    Review of Systems:   Unable to be completed secondary to patient's neurologic status.  Past Medical History  Hypertension Diabetes mellitus type 2 Hyperlipidemia  Surgical History   Unknown  Social History  Unknown  Family History   His family history is not on file.   Allergies No Known Allergies   Home Medications  Prior to Admission medications   Not on File     Critical care time: CRITICAL CARE Performed by: Karren Burly   Total critical care time: 30 minutes  Critical care time was exclusive of separately billable procedures and treating other patients.  Critical care was necessary to treat or prevent imminent or life-threatening deterioration.  Critical care was time spent personally by me on the following activities: development of treatment plan with patient and/or surrogate as well as nursing, discussions with consultants, evaluation of patient's response to treatment, examination of patient, obtaining history from patient or surrogate, ordering and performing  treatments and interventions, ordering and review of laboratory studies, ordering and review of radiographic studies, pulse oximetry and re-evaluation of patient's condition.

## 2020-09-08 NOTE — Plan of Care (Signed)
  Problem: Clinical Measurements: Goal: Ability to maintain clinical measurements within normal limits will improve Outcome: Progressing Goal: Will remain free from infection Outcome: Progressing Goal: Respiratory complications will improve Outcome: Progressing   Problem: Nutrition: Goal: Adequate nutrition will be maintained Outcome: Progressing   Problem: Coping: Goal: Level of anxiety will decrease Outcome: Progressing   Problem: Pain Managment: Goal: General experience of comfort will improve Outcome: Progressing   Problem: Safety: Goal: Ability to remain free from injury will improve Outcome: Progressing   Problem: Skin Integrity: Goal: Risk for impaired skin integrity will decrease Outcome: Progressing   Problem: Nutrition: Goal: Dietary intake will improve Outcome: Progressing

## 2020-09-08 NOTE — Progress Notes (Signed)
SLP Cancellation Note  Patient Details Name: Terry Howell. MRN: [id number] DOB: [birth year]   Cancelled treatment:       Reason Eval/Treat Not Completed: Medical issues which prohibited therapy (Pt remains on the vent. SLP will follow up. )  Elsie Sakuma I. Vear Clock, MS, CCC-SLP Acute Rehabilitation Services Office number [phone] Pager [phone]  Scheryl Marten 09/08/2020, 8:33 AM

## 2020-09-09 ENCOUNTER — Inpatient Hospital Stay (HOSPITAL_COMMUNITY): Payer: Medicare Other

## 2020-09-09 ENCOUNTER — Encounter (HOSPITAL_COMMUNITY): Payer: Self-pay | Admitting: Interventional Radiology

## 2020-09-09 DIAGNOSIS — R1312 Dysphagia, oropharyngeal phase: Secondary | ICD-10-CM

## 2020-09-09 DIAGNOSIS — E1159 Type 2 diabetes mellitus with other circulatory complications: Secondary | ICD-10-CM

## 2020-09-09 DIAGNOSIS — I5043 Acute on chronic combined systolic (congestive) and diastolic (congestive) heart failure: Secondary | ICD-10-CM

## 2020-09-09 LAB — URINALYSIS, ROUTINE W REFLEX MICROSCOPIC
Bilirubin Urine: NEGATIVE
Glucose, UA: >=500 mg/dL — AB
Hgb urine dipstick: NEGATIVE
Ketones, ur: NEGATIVE mg/dL
Leukocytes,Ua: NEGATIVE
Nitrite: NEGATIVE
Protein, ur: NEGATIVE mg/dL
Specific Gravity, Urine: 1.016 (ref 1.005–1.030)
pH: 5 (ref 5.0–8.0)

## 2020-09-09 LAB — BASIC METABOLIC PANEL
Anion gap: 11 (ref 5–15)
Anion gap: 13 (ref 5–15)
BUN: 39 mg/dL — ABNORMAL HIGH (ref 8–23)
BUN: 42 mg/dL — ABNORMAL HIGH (ref 8–23)
CO2: 22 mmol/L (ref 22–32)
CO2: 23 mmol/L (ref 22–32)
Calcium: 8.2 mg/dL — ABNORMAL LOW (ref 8.9–10.3)
Calcium: 8.4 mg/dL — ABNORMAL LOW (ref 8.9–10.3)
Chloride: 109 mmol/L (ref 98–111)
Chloride: 109 mmol/L (ref 98–111)
Creatinine, Ser: 1.14 mg/dL (ref 0.61–1.24)
Creatinine, Ser: 1.12 mg/dL (ref 0.61–1.24)
GFR calc Af Amer: >60 mL/min (ref >60)
GFR calc Af Amer: >60 mL/min (ref >60)
GFR calc non Af Amer: >60 mL/min (ref >60)
GFR calc non Af Amer: >60 mL/min (ref >60)
Glucose, Bld: 378 mg/dL — ABNORMAL HIGH (ref 70–99)
Glucose, Bld: 438 mg/dL — ABNORMAL HIGH (ref 70–99)
Potassium: 3.8 mmol/L (ref 3.5–5.1)
Potassium: 3.5 mmol/L (ref 3.5–5.1)
Sodium: 142 mmol/L (ref 135–145)
Sodium: 145 mmol/L (ref 135–145)

## 2020-09-09 LAB — GLUCOSE, CAPILLARY
Glucose-Capillary: 146 mg/dL — ABNORMAL HIGH (ref 70–99)
Glucose-Capillary: 163 mg/dL — ABNORMAL HIGH (ref 70–99)
Glucose-Capillary: 167 mg/dL — ABNORMAL HIGH (ref 70–99)
Glucose-Capillary: 205 mg/dL — ABNORMAL HIGH (ref 70–99)
Glucose-Capillary: 255 mg/dL — ABNORMAL HIGH (ref 70–99)
Glucose-Capillary: 291 mg/dL — ABNORMAL HIGH (ref 70–99)
Glucose-Capillary: 340 mg/dL — ABNORMAL HIGH (ref 70–99)
Glucose-Capillary: 354 mg/dL — ABNORMAL HIGH (ref 70–99)
Glucose-Capillary: 368 mg/dL — ABNORMAL HIGH (ref 70–99)
Glucose-Capillary: 370 mg/dL — ABNORMAL HIGH (ref 70–99)
Glucose-Capillary: 387 mg/dL — ABNORMAL HIGH (ref 70–99)
Glucose-Capillary: 392 mg/dL — ABNORMAL HIGH (ref 70–99)

## 2020-09-09 LAB — CBC
HCT: 32.9 % — ABNORMAL LOW (ref 39.0–52.0)
Hemoglobin: 10.3 g/dL — ABNORMAL LOW (ref 13.0–17.0)
MCH: 32.2 pg (ref 26.0–34.0)
MCHC: 31.3 g/dL (ref 30.0–36.0)
MCV: 102.8 fL — ABNORMAL HIGH (ref 80.0–100.0)
Platelets: 186 10*3/uL (ref 150–400)
RBC: 3.2 MIL/uL — ABNORMAL LOW (ref 4.22–5.81)
RDW: 14.6 % (ref 11.5–15.5)
WBC: 17.6 10*3/uL — ABNORMAL HIGH (ref 4.0–10.5)
nRBC: 0 % (ref 0.0–0.2)

## 2020-09-09 LAB — MAGNESIUM
Magnesium: 2 mg/dL (ref 1.7–2.4)
Magnesium: 1.9 mg/dL (ref 1.7–2.4)

## 2020-09-09 LAB — PHOSPHORUS
Phosphorus: 2.7 mg/dL (ref 2.5–4.6)
Phosphorus: 2.6 mg/dL (ref 2.5–4.6)

## 2020-09-09 MED ORDER — PROSOURCE TF PO LIQD
90.0000 mL | Freq: Three times a day (TID) | ORAL | Status: DC
  Administered 2020-09-09 – 2020-09-10 (×3): 90 mL
  Filled 2020-09-09 (×3): qty 90

## 2020-09-09 MED ORDER — PRAVASTATIN SODIUM 10 MG PO TABS
20.0000 mg | ORAL_TABLET | Freq: Every day | ORAL | Status: DC
  Administered 2020-09-09: 20 mg via ORAL
  Filled 2020-09-09: qty 2

## 2020-09-09 MED ORDER — ASPIRIN 325 MG PO TABS
325.0000 mg | ORAL_TABLET | Freq: Every day | ORAL | Status: DC
  Administered 2020-09-09 – 2020-09-10 (×2): 325 mg
  Filled 2020-09-09: qty 1

## 2020-09-09 MED ORDER — INSULIN GLARGINE 100 UNIT/ML ~~LOC~~ SOLN
30.0000 [IU] | Freq: Every day | SUBCUTANEOUS | Status: DC
  Administered 2020-09-09: 30 [IU] via SUBCUTANEOUS
  Filled 2020-09-09 (×2): qty 0.3

## 2020-09-09 MED ORDER — INSULIN REGULAR(HUMAN) IN NACL 100-0.9 UT/100ML-% IV SOLN
INTRAVENOUS | Status: DC
  Administered 2020-09-09: 21 [IU]/h via INTRAVENOUS
  Administered 2020-09-09: 18 [IU]/h via INTRAVENOUS
  Administered 2020-09-10: 8 [IU]/h via INTRAVENOUS
  Filled 2020-09-09 (×4): qty 100

## 2020-09-09 MED ORDER — ASPIRIN 325 MG PO TABS
325.0000 mg | ORAL_TABLET | Freq: Every day | ORAL | Status: DC
  Filled 2020-09-09: qty 1

## 2020-09-09 MED ORDER — FUROSEMIDE 10 MG/ML IJ SOLN
40.0000 mg | Freq: Two times a day (BID) | INTRAMUSCULAR | Status: AC
  Administered 2020-09-09: 40 mg via INTRAVENOUS
  Filled 2020-09-09: qty 4

## 2020-09-09 MED ORDER — DEXTROSE 50 % IV SOLN
0.0000 mL | INTRAVENOUS | Status: DC | PRN
  Filled 2020-09-09: qty 50

## 2020-09-09 MED ORDER — VITAL HIGH PROTEIN PO LIQD
1000.0000 mL | ORAL | Status: DC
  Administered 2020-09-09: 1000 mL
  Filled 2020-09-09 (×2): qty 1000

## 2020-09-09 MED ORDER — ASPIRIN 325 MG PO TABS: 325.0000 mg | ORAL_TABLET | Freq: Every day | ORAL | Status: DC

## 2020-09-09 NOTE — Progress Notes (Signed)
Inpatient Rehab Admissions Coordinator Note:   Per therapy recommendations, pt was screened for CIR candidacy by Megan Salon, MS CCC-SLP. At this time, Pt. Remains on vent. I will follow for progress once off vent and place rehab consult if Pt. Appears to be an appropriate candidate.   Please contact me with questions.    Megan Salon, MS, CCC-SLP Rehab Admissions Coordinator  [phone] (celll) [phone] (office)

## 2020-09-09 NOTE — Consult Note (Signed)
NAME:  Terry Howell, [place] MRN:  [id number], DOB:  [birth year], LOS: 3 ADMISSION DATE:  09/14/2020, CONSULTATION DATE:  09/18 REFERRING MD: Dr Laurence Slate , CHIEF COMPLAINT: MCA CVA  Brief History   64 year old white male that presented with acute onset CVA.  History of present illness   64 year old white male who presented from home to [facility].  Was discovered on the floor by the patient's wife after falling from standing position.  Patient presented with right-sided weakness aphasia and right facial droop.  Last known normal time was at 1600 hrs. 09/17.  Patient did have a cough for approximately 1 week but is Covid negative.  TPA was administered at 2003 hrs. on 09/04/2020.  Past Medical History  Hypertension Diabetes Hyperlipidemia  Consults:  Neurology  Procedures:  Thrombectomy  Significant Diagnostic Tests:  CT head 09/17: Impression: 1.  No acute intercranial hemorrhage or cortically based infarct identified but suspicious density of the left MCA M1 segment consider emergent large vessel occlusion in the appropriate setting. 2.  Brain parenchyma appears normal for age Interval Events:  Follows commands on left, not on right. Arouses to voice. Doing ok on PSV this AM, weaning as able  Objective   Blood pressure 122/71, pulse 98, temperature 100 F (37.8 C), temperature source Bladder, resp. rate 15, weight 124.4 kg, SpO2 96 %.    Vent Mode: PSV;CPAP FiO2 (%):  [40 %] 40 % Set Rate:  [14 bmp] 14 bmp Vt Set:  [550 mL] 550 mL PEEP:  [5 cmH20-8 cmH20] 5 cmH20 Pressure Support:  [12 cmH20-16 cmH20] 12 cmH20 Plateau Pressure:  [18 cmH20-20 cmH20] 20 cmH20   Intake/Output Summary (Last 24 hours) at 09/09/2020 1113 Last data filed at 09/09/2020 0800 Gross per 24 hour  Intake 1274.33 ml  Output 1750 ml  Net -475.67 ml   Filed Weights   08/31/2020 2315 09/09/20 0500  Weight: 125 kg 124.4 kg    Examination: General: Atraumatic HENT: Atraumatic/normocephalic.   Orally intubated.  Mucous membranes are moist. Lungs:Mechanical sounds.  Bilateral breath sounds intact. Cardiovascular: Regular rate no rub murmur gallop appreciated. Abdomen: Soft, nondistended, positive bowel sounds, no rebound/rigidity/guarding the limited by neurologic status. Extremities: Distal pulse intact x4.  +2 pedal edema.  No acrocyanosis.  No clubbing. Neuro: RASS -4.  Chemically sedated. GU: Foley catheter intact.    Assessment & Plan:  Acute respiratory failure: Due to pulmonary edema and post operative state. Tachypneic on PSV. --Diurese --Vent support --PSV, SBT when able --minimize propofol, fentanyl PRN  Left M1 occlusive CVA Status post thrombectomy Status post IV TPA --Per neuro  Congestive heart failure w/ reduced EF with associated pulmonary edema --Diurese --Holding losartan, metop in setting of borderline Bps.  DM: Hyperglycemic on TFs --Start glargine, continue SSI  Best practice:  Diet: Tube feeds Pain/Anxiety/Delirium protocol (if indicated): propofol,  VAP protocol (if indicated): Initiated DVT prophylaxis: SCDs only GI prophylaxis: Protonix Glucose control: Insulin sliding scale Mobility: Bedrest Code Status: Full code Family Communication:   Disposition: ICU  Labs   CBC: Recent Labs  Lab 09/07/20 0336 09/07/20 0935 09/08/20 0706 09/09/20 0357  WBC  --  19.4* 20.6* 17.6*  HGB 10.2* 10.5* 10.4* 10.3*  HCT 30.0* 33.5* 32.9* 32.9*  MCV  --  100.9* 103.8* 102.8*  PLT  --  197 185 186    Basic Metabolic Panel: Recent Labs  Lab 09/07/20 0336 09/07/20 0935 09/08/20 0706 09/08/20 1206 09/08/20 1651 09/09/20 0357  NA 142 141 141  --   --  142  K 3.7 3.7 3.8  --   --  3.8  CL  --  107 108  --   --  109  CO2  --  22 20*  --   --  22  GLUCOSE  --  162* 213*  --   --  378*  BUN  --  34* 31*  --   --  39*  CREATININE  --  1.11 1.10  --   --  1.14  CALCIUM  --  8.4* 8.1*  --   --  8.2*  MG  --  1.9  --  1.8 1.8 2.0  PHOS  --   3.6  --  3.2 3.3 2.7   GFR: CrCl cannot be calculated (Unknown ideal weight.). Recent Labs  Lab 09/07/20 0935 09/08/20 0706 09/09/20 0357  WBC 19.4* 20.6* 17.6*    Liver Function Tests: No results for input(s): AST, ALT, ALKPHOS, BILITOT, PROT, ALBUMIN in the last 168 hours. No results for input(s): LIPASE, AMYLASE in the last 168 hours. No results for input(s): AMMONIA in the last 168 hours.  ABG    Component Value Date/Time   PHART 7.428 09/07/2020 0336   PCO2ART 35.5 09/07/2020 0336   PO2ART 306 (H) 09/07/2020 0336   HCO3 23.5 09/07/2020 0336   TCO2 25 09/07/2020 0336   ACIDBASEDEF 1.0 09/07/2020 0336   O2SAT 100.0 09/07/2020 0336     Coagulation Profile: No results for input(s): INR, PROTIME in the last 168 hours.  Cardiac Enzymes: No results for input(s): CKTOTAL, CKMB, CKMBINDEX, TROPONINI in the last 168 hours.  HbA1C: Hgb A1c MFr Bld  Date/Time Value Ref Range Status  09/07/2020 09:35 AM 8.7 (H) 4.8 - 5.6 % Final    Comment:    (NOTE) Pre diabetes:          5.7%-6.4%  Diabetes:              >6.4%  Glycemic control for   <7.0% adults with diabetes     CBG: Recent Labs  Lab 09/08/20 1650 09/08/20 2015 09/09/20 0016 09/09/20 0405 09/09/20 0843  GLUCAP 276* 312* 368* 340* 370*    Review of Systems:   Unable to be completed secondary to patient's neurologic status.  Past Medical History  Hypertension Diabetes mellitus type 2 Hyperlipidemia  Surgical History   Unknown  Social History  Unknown  Family History   His family history is not on file.   Allergies No Known Allergies   Home Medications  Prior to Admission medications   Not on File     Critical care time: CRITICAL CARE Performed by: Karren Burly   Total critical care time: 30 minutes  Critical care time was exclusive of separately billable procedures and treating other patients.  Critical care was necessary to treat or prevent imminent or life-threatening  deterioration.  Critical care was time spent personally by me on the following activities: development of treatment plan with patient and/or surrogate as well as nursing, discussions with consultants, evaluation of patient's response to treatment, examination of patient, obtaining history from patient or surrogate, ordering and performing treatments and interventions, ordering and review of laboratory studies, ordering and review of radiographic studies, pulse oximetry and re-evaluation of patient's condition.

## 2020-09-09 NOTE — Evaluation (Signed)
Occupational Therapy Evaluation Patient Details Name: Terry Howell. MRN: [id number] DOB: 02/28/1956 Today's Date: 09/09/2020    History of Present Illness Terry Howell. is a 64 y.o. male with past medical history significant for hypertension, hyperlipidemia, uncontrolled insulin-dependent diabetes mellitus, history of coronary artery disease status post PCI in 2004, functioning at 40%,, heart failure presents to the emergency department [facility] with sudden onset aphasia and right-sided weakness on 9/17. Transferred to [facility] for mechanical thrombectomy of L M1 occlusion, intubated 9/17.    Clinical Impression   This 64 yo male admitted with above presents to acute OT with PLOF believed to be totally independent (taking out the trash when CVA occurred). Currently he is total A +2 for all mobility and total A for all basic ADLs. He will continue to benefit from acute OT with follow up on CIR.    Follow Up Recommendations  CIR    Equipment Recommendations  Other (comment) (TBD next venue)    Recommendations for Other Services Rehab consult     Precautions / Restrictions Precautions Precautions: Fall Precaution Comments: intubated, in process of weaning Restrictions Weight Bearing Restrictions: No      Mobility Bed Mobility Overal bed mobility: Needs Assistance Bed Mobility: Supine to Sit;Sit to Supine     Supine to sit: Total assist;+2 for physical assistance (RN to manage vent) Sit to supine: Total assist;+2 for physical assistance (RN to manage vent)   General bed mobility comments: pt with no initiation, pt with intermittent eyes opening but unable to sustain  Transfers                 General transfer comment: not appropriate at this time    Balance Overall balance assessment: Needs assistance   Sitting balance-Leahy Scale: Poor Sitting balance - Comments: pt supporting self with L UE, mild pushing to the R, R lateral lean, maxA posteriorly to  maintain EOB balance Postural control: Right lateral lean                                 ADL either performed or assessed with clinical judgement   ADL                                         General ADL Comments: total A     Vision   Additional Comments: Pt not tracking, eyes opened intermittently, eyes not past midline            Pertinent Vitals/Pain Pain Assessment: Faces Faces Pain Scale: Hurts little more Pain Location: grimacing with noxious stimuli x 4 extremities, + withdrawl x 4 extremities     Hand Dominance Right   Extremity/Trunk Assessment Upper Extremity Assessment Upper Extremity Assessment: RUE deficits/detail;LUE deficits/detail RUE Deficits / Details: Pt not showing purposeful movement; PROM WNL LUE Deficits / Details: Pt showing purposeful movement of supporting himself with LUE (pushing at times) and reaching up towards vent.           Communication Communication Communication: Expressive difficulties (?aphasia, intubated)   Cognition Arousal/Alertness: Lethargic Behavior During Therapy: Flat affect Overall Cognitive Status: Difficult to assess                                 General Comments: pt  with delayed processing, following simple commands 50% of time with L UE and LE.               Home Living Family/patient expects to be discharged to:: Private residence Living Arrangements: Spouse/significant other                               Additional Comments: pt intubated, unable to provide report of PLOF      Prior Functioning/Environment Level of Independence: Independent        Comments: was taking trash out when this incident occured        OT Problem List: Decreased strength;Impaired balance (sitting and/or standing);Impaired vision/perception;Decreased coordination;Decreased cognition;Decreased safety awareness;Impaired sensation;Impaired tone;Impaired UE functional  use      OT Treatment/Interventions: Self-care/ADL training;DME and/or AE instruction;Patient/family education;Balance training;Therapeutic activities;Therapeutic exercise;Visual/perceptual remediation/compensation    OT Goals(Current goals can be found in the care plan section) Acute Rehab OT Goals Patient Stated Goal: unable to state OT Goal Formulation: Patient unable to participate in goal setting Time For Goal Achievement: 09/23/20 Potential to Achieve Goals: Good  OT Frequency: Min 2X/week           Co-evaluation PT/OT/SLP Co-Evaluation/Treatment: Yes Reason for Co-Treatment: Complexity of the patient's impairments (multi-system involvement);For patient/therapist safety PT goals addressed during session: Mobility/safety with mobility OT goals addressed during session: Strengthening/ROM      AM-PAC OT "6 Clicks" Daily Activity     Outcome Measure Help from another person eating meals?: Total Help from another person taking care of personal grooming?: Total Help from another person toileting, which includes using toliet, bedpan, or urinal?: Total Help from another person bathing (including washing, rinsing, drying)?: Total Help from another person to put on and taking off regular upper body clothing?: Total Help from another person to put on and taking off regular lower body clothing?: Total 6 Click Score: 6   End of Session Nurse Communication:  (RN A'd [place])  Activity Tolerance: Patient limited by lethargy (eyes closed alot) Patient left: in bed;with call bell/phone within reach;with bed alarm set  OT Visit Diagnosis: Other abnormalities of gait and mobility (R26.89);Muscle weakness (generalized) (M62.81);Low vision, both eyes (H54.2);Other symptoms and signs involving cognitive function;Hemiplegia and hemiparesis Hemiplegia - Right/Left: Right Hemiplegia - dominant/non-dominant:  (unknown) Hemiplegia - caused by: Cerebral infarction                Time: 6789-3810 OT  Time Calculation (min): 25 min Charges:  OT General Charges $OT Visit: 1 Visit OT Evaluation $OT Eval Moderate Complexity: 1 Mod  Ignacia Palma, OTR/L Acute [employer] Pager [phone] Office [phone]     Evette Georges 09/09/2020, 3:16 PM

## 2020-09-09 NOTE — Progress Notes (Signed)
SLP Cancellation Note  Patient Details Name: Terry Howell. MRN: [id number] DOB: Jun 23, 1956   Cancelled treatment:       Reason Eval/Treat Not Completed: Medical issues which prohibited therapy. Pt intubated will f/u as needed   Colin Norment, Riley Nearing 09/09/2020, 9:06 AM

## 2020-09-09 NOTE — Plan of Care (Signed)
  Problem: Clinical Measurements: Goal: Will remain free from infection Outcome: Progressing Goal: Respiratory complications will improve Outcome: Progressing   Problem: Activity: Goal: Risk for activity intolerance will decrease Outcome: Progressing   Problem: Nutrition: Goal: Adequate nutrition will be maintained Outcome: Progressing   Problem: Coping: Goal: Level of anxiety will decrease Outcome: Progressing   Problem: Pain Managment: Goal: General experience of comfort will improve Outcome: Progressing   Problem: Safety: Goal: Ability to remain free from injury will improve Outcome: Progressing   Problem: Skin Integrity: Goal: Risk for impaired skin integrity will decrease Outcome: Progressing   Problem: Nutrition: Goal: Dietary intake will improve Outcome: Progressing

## 2020-09-09 NOTE — Progress Notes (Signed)
STROKE TEAM PROGRESS NOTE   INTERVAL HISTORY RN at bedside. Pt still intubated but eyes open and able to gaze to the left more than right. Still not following commands. Discussed with CCM, hope to extubate today. Has OG now, will start TF and po meds.    OBJECTIVE Vitals:   09/09/20 1100 09/09/20 1104 09/09/20 1200 09/09/20 1300  BP: 139/81  129/69 129/74  Pulse: (!) 112  (!) 108 100  Resp: (!) 31  (!) 31 20  Temp: (!) 100.4 F (38 C)  (!) 100.8 F (38.2 C) (!) 100.8 F (38.2 C)  TempSrc:   Bladder   SpO2: 99% 96% 98% 98%  Weight:       CBC:  Recent Labs  Lab 09/08/20 0706 09/09/20 0357  WBC 20.6* 17.6*  HGB 10.4* 10.3*  HCT 32.9* 32.9*  MCV 103.8* 102.8*  PLT 185 186   Basic Metabolic Panel:  Recent Labs  Lab 09/08/20 0706 09/08/20 1206 09/08/20 1651 09/09/20 0357  NA 141  --   --  142  K 3.8  --   --  3.8  CL 108  --   --  109  CO2 20*  --   --  22  GLUCOSE 213*  --   --  378*  BUN 31*  --   --  39*  CREATININE 1.10  --   --  1.14  CALCIUM 8.1*  --   --  8.2*  MG  --    < > 1.8 2.0  PHOS  --    < > 3.3 2.7   < > = values in this interval not displayed.   Lipid Panel:     Component Value Date/Time   CHOL 97 09/07/2020 0935   TRIG 86 09/07/2020 0935   HDL 30 (L) 09/07/2020 0935   CHOLHDL 3.2 09/07/2020 0935   VLDL 17 09/07/2020 0935   LDLCALC 50 09/07/2020 0935   HgbA1c:  Lab Results  Component Value Date   HGBA1C 8.7 (H) 09/07/2020   Urine Drug Screen:     Component Value Date/Time   LABOPIA NONE DETECTED 09/07/2020 0935   COCAINSCRNUR NONE DETECTED 09/07/2020 0935   LABBENZ NONE DETECTED 09/07/2020 0935   AMPHETMU NONE DETECTED 09/07/2020 0935   THCU NONE DETECTED 09/07/2020 0935   LABBARB NONE DETECTED 09/07/2020 0935    Alcohol Level No results found for: ETH  IMAGING  MR ANGIO HEAD WO CONTRAST  Result Date: 09/07/2020 CLINICAL DATA:  64 year old male code stroke presentation yesterday with left MCA ELVO at [facility]. Status  post IV tPA and endovascular revascularization. EXAM: MRI HEAD WITHOUT CONTRAST MRA HEAD WITHOUT CONTRAST TECHNIQUE: Multiplanar, multiecho pulse sequences of the brain and surrounding structures were obtained without intravenous contrast. Angiographic images of the head were obtained using MRA technique without contrast. COMPARISON:  [facility] CT head and CTA head and neck yesterday. FINDINGS: MRI HEAD FINDINGS Brain: Patchy restricted diffusion and petechial hemorrhage in the left basal ganglia which are mildly expanded (series 6, image 85, series 14, image 32). Heidelberg Classification type 1c vs. type 2 petechial blood. Mild additional mostly punctate but occasional patchy scattered restricted diffusion in the superior left temporal lobe, posterior left insula. No additional blood products. No contralateral or posterior fossa restricted diffusion. No midline shift, ventriculomegaly, extra-axial collection. Outside of the acute findings the gray and white matter signal is largely normal for age. Cervicomedullary junction and pituitary are within normal limits. Vascular: Major intracranial vascular flow voids are  preserved. Skull and upper cervical spine: Negative for age visible cervical spine. Visualized bone marrow signal is within normal limits. Sinuses/Orbits: Postoperative changes to both globes. Minor ethmoid and sphenoid sinus disease. Other: Intubated. Small volume of fluid layering in the pharynx. Mastoids are clear. Visible internal auditory structures appear normal. MRA HEAD FINDINGS Antegrade flow in the posterior circulation with mildly dominant left vertebral artery. No distal vertebral or basilar stenosis. Patent PICA origins, AICA, SCA and PCA origins. Posterior communicating arteries are diminutive or absent. Bilateral PCA branches are within normal limits. Antegrade flow in both ICA siphons. No siphon stenosis. Normal ophthalmic artery origins. Patent carotid termini. Patent MCA and ACA  origins. Anterior communicating artery, visible ACA branches, right MCA M1 and visible right MCA branches are within normal limits. Patent left MCA M1 and bifurcation now. No significant vessel irregularity. Visible left MCA branches are within normal limits. IMPRESSION: 1. Left lenticulostriate territory infarcts with patchy petechial hemorrhage and mildly expanded left basal ganglia. No significant intracranial mass effect. Elsewhere there are a few scattered punctate and occasionally patchy infarcts in the Left MCA territory. 2. Normal Left MCA and negative intracranial MRA. 3. Outside of the left MCA territory negative for age MRI appearance of the brain. Electronically Signed   By: Odessa Fleming M.D.   On: 09/07/2020 19:22   MR BRAIN WO CONTRAST  Result Date: 09/07/2020 CLINICAL DATA:  64 year old male code stroke presentation yesterday with left MCA ELVO at [facility]. Status post IV tPA and endovascular revascularization. EXAM: MRI HEAD WITHOUT CONTRAST MRA HEAD WITHOUT CONTRAST TECHNIQUE: Multiplanar, multiecho pulse sequences of the brain and surrounding structures were obtained without intravenous contrast. Angiographic images of the head were obtained using MRA technique without contrast. COMPARISON:  [facility] CT head and CTA head and neck yesterday. FINDINGS: MRI HEAD FINDINGS Brain: Patchy restricted diffusion and petechial hemorrhage in the left basal ganglia which are mildly expanded (series 6, image 85, series 14, image 32). Heidelberg Classification type 1c vs. type 2 petechial blood. Mild additional mostly punctate but occasional patchy scattered restricted diffusion in the superior left temporal lobe, posterior left insula. No additional blood products. No contralateral or posterior fossa restricted diffusion. No midline shift, ventriculomegaly, extra-axial collection. Outside of the acute findings the gray and white matter signal is largely normal for age. Cervicomedullary junction  and pituitary are within normal limits. Vascular: Major intracranial vascular flow voids are preserved. Skull and upper cervical spine: Negative for age visible cervical spine. Visualized bone marrow signal is within normal limits. Sinuses/Orbits: Postoperative changes to both globes. Minor ethmoid and sphenoid sinus disease. Other: Intubated. Small volume of fluid layering in the pharynx. Mastoids are clear. Visible internal auditory structures appear normal. MRA HEAD FINDINGS Antegrade flow in the posterior circulation with mildly dominant left vertebral artery. No distal vertebral or basilar stenosis. Patent PICA origins, AICA, SCA and PCA origins. Posterior communicating arteries are diminutive or absent. Bilateral PCA branches are within normal limits. Antegrade flow in both ICA siphons. No siphon stenosis. Normal ophthalmic artery origins. Patent carotid termini. Patent MCA and ACA origins. Anterior communicating artery, visible ACA branches, right MCA M1 and visible right MCA branches are within normal limits. Patent left MCA M1 and bifurcation now. No significant vessel irregularity. Visible left MCA branches are within normal limits. IMPRESSION: 1. Left lenticulostriate territory infarcts with patchy petechial hemorrhage and mildly expanded left basal ganglia. No significant intracranial mass effect. Elsewhere there are a few scattered punctate and occasionally patchy infarcts in the  Left MCA territory. 2. Normal Left MCA and negative intracranial MRA. 3. Outside of the left MCA territory negative for age MRI appearance of the brain. Electronically Signed   By: Odessa Fleming M.D.   On: 09/07/2020 19:22   IR CT Head Ltd  Result Date: 09/07/2020 INDICATION: 63-year-ol male presents for mechanical thrombectomy of left emergent large vessel occlusion involving the left M1 EXAM: ULTRASOUND-GUIDED ACCESS RIGHT COMMON FEMORAL ARTERY CERVICAL AND CEREBRAL ANGIOGRAM MECHANICAL THROMBECTOMY LEFT MCA ANGIO-SEAL FOR  HEMOSTASIS COMPARISON:  CT imaging same day MEDICATIONS: None ANESTHESIA/SEDATION: The anesthesia team was present to provide general endotracheal tube anesthesia and for patient monitoring during the procedure. Intubation was performed in negative pressure Bay in neuro IR holding. Left radial arterial line was performed by the anesthesia team. Interventional neuro radiology nursing staff was also present. CONTRAST:  60 cc contrast FLUOROSCOPY TIME:  Fluoroscopy Time: 8 minutes 48 seconds (778 mGy). COMPLICATIONS: None TECHNIQUE: Informed written consent was obtained from the patient's family after a thorough discussion of the procedural risks, benefits and alternatives. Specific risks discussed include: Bleeding, infection, contrast reaction, kidney injury/failure, need for further procedure/surgery, arterial injury or dissection, embolization to new territory, intracranial hemorrhage (10-15% risk), neurologic deterioration, cardiopulmonary collapse, death. All questions were addressed. Maximal Sterile Barrier Technique was utilized including during the procedure including caps, mask, sterile gowns, sterile gloves, sterile drape, hand hygiene and skin antiseptic. A timeout was performed prior to the initiation of the procedure. The anesthesia team was present to provide general endotracheal tube anesthesia and for patient monitoring during the procedure. Interventional neuro radiology nursing staff was also present. FINDINGS: Initial Findings: Left common carotid artery:  Normal course caliber and contour. Left external carotid artery: Patent with antegrade flow. Left internal carotid artery: Normal course caliber and contour of the cervical portion. Vertical and petrous segment patent with normal course caliber contour. Cavernous segment patent. Clinoid segment patent. Antegrade flow of the ophthalmic artery. Ophthalmic segment patent. Terminus patent. Left MCA: No significant atherosclerotic changes are narrowing  of the left MCA. There is a fairly early division/origin of the MCA, with an upgoing branch just beyond the lenticulostriate arteries, superior division. There is a orbital frontal or frontal polar branch in a downgoing configuration. The dominant branch is the inferior division, which is apparently the dominant division given its supply to the parietal territory after reperfusion. Of note, there is an M4 occlusion of the early superior branch, which remains occluded throughout. Left ACA: A 1 segment patent. A 2 segment perfuses the right territory. Significant p.o. collaterals are identified, attempting to reperfuse the parietal territory and the temporal territory from the ACA distribution in the watershed area. Completion Findings: Left MCA: Complete reperfusion of the dominant inferior division of the M1 with no embolization new territory. Flat panel CT: No hemorrhage. There is stagnant focus of contrast associated with the M4 occlusion, within a separate arterial territory than the treated M1 segment. PROCEDURE: The anesthesia team was present to provide general endotracheal tube anesthesia and for patient monitoring during the procedure. Intubation was performed in negative pressure Bay in neuro IR holding. Interventional neuro radiology nursing staff was also present. Ultrasound survey of the right inguinal region was performed with images stored and sent to PACs. 11 blade scalpel was used to make a small incision. Blunt dissection was performed with US guidance. A micropuncture needle was used access the right common femoral artery under ultrasound. With excellent arterial blood flow returned, an .018 micro wire was passed through  the needle, observed to enter the abdominal aorta under fluoroscopy. The needle was removed, and a micropuncture sheath was placed over the wire. The inner dilator and wire were removed, and an 035 wire was advanced under fluoroscopy into the abdominal aorta. The sheath was removed  and a 25cm 464F straight vascular sheath was placed. The dilator was removed and the sheath was flushed. Sheath was attached to pressurized and heparinized saline bag for constant forward flow. A coaxial system was then advanced over the 035 wire. This included a 95cm 087 "Walrus" balloon guide with coaxial 125cm Berenstein diagnostic catheter. This was advanced to the proximal descending thoracic aorta. Wire was then removed. Double flush of the catheter was performed. Catheter was then used to select the left common carotid artery. Angiogram was performed. Using roadmap technique, the catheter was advanced over a standard glide wire into left cervical ICA, with distal position achieved of the balloon guide. The diagnostic catheter and the wire were removed. Formal angiogram was performed. Road map function was used once the occluded vessel was identified. Copious back flush was performed and the balloon catheter was attached to heparinized and pressurized saline bag for forward flow. A second coaxial system was then advanced through the balloon catheter, which included the selected intermediate catheter, microcatheter, and microwire. In this scenario, the set up included a 137cm zoom 71 intermediate catheter, a Trevo Pro18 microcatheter, and 014 synchro soft wire. This system was advanced through the balloon guide catheter under the road-map function, with adequate back-flush at the rotating hemostatic valve at that back end of the balloon guide. Microcatheter and the intermediate catheter system were advanced through the terminal ICA and MCA to the level of the occlusion. The micro wire was then carefully advanced up to the occluded segment. Intermediate catheter was then advanced on the microcatheter and wire combination, up to the proximal face of the thrombus. The microcatheter and microwire were then gently removed and the rotating hemostatic valve was removed from the intermediate catheter. Direct aspiration  was then performed through the zoom catheter. Zoom catheter was then gently withdrawn with full aspiration, while manipulating the balloon guide more proximally within the cervical artery. Zoom catheter was slowly withdrawn from the balloon guide. Angiogram was performed, confirming complete restoration of flow. Angiogram of the cervical ICA was performed. Balloon guide was then removed. The skin at the puncture site was then cleaned with Chlorhexidine. The 8 French sheath was removed and an 464F angioseal was deployed. Flat panel CT was performed. Patient was extubated once the CT was reviewed. Patient tolerated the procedure well and remained hemodynamically stable throughout. No complications were encountered and no significant blood loss encountered. IMPRESSION: Status post ultrasound guided access right common femoral artery for left-sided cervical/cerebral angiogram and mechanical thrombectomy of left distal M1 occlusion using direct aspiration technique and achieving complete reperfusion of the occluded vessel. Angio-Seal for hemostasis. PLAN: The patient will remain intubated, given his comorbidities ICU status Target systolic blood pressure of 120-140 Right hip straight time 6 hours Frequent neurovascular checks Repeat neurologic imaging with CT and/MRI at the discretion of neurology team Electronically Signed   By: Gilmer Mor D.O.   On: 09/07/2020 10:18   IR US Guide Vasc Access Right  Result Date: 09/07/2020 INDICATION: 63-year-ol male presents for mechanical thrombectomy of left emergent large vessel occlusion involving the left M1 EXAM: ULTRASOUND-GUIDED ACCESS RIGHT COMMON FEMORAL ARTERY CERVICAL AND CEREBRAL ANGIOGRAM MECHANICAL THROMBECTOMY LEFT MCA ANGIO-SEAL FOR HEMOSTASIS COMPARISON:  CT imaging same  day MEDICATIONS: None ANESTHESIA/SEDATION: The anesthesia team was present to provide general endotracheal tube anesthesia and for patient monitoring during the procedure. Intubation was  performed in negative pressure Bay in neuro IR holding. Left radial arterial line was performed by the anesthesia team. Interventional neuro radiology nursing staff was also present. CONTRAST:  60 cc contrast FLUOROSCOPY TIME:  Fluoroscopy Time: 8 minutes 48 seconds (778 mGy). COMPLICATIONS: None TECHNIQUE: Informed written consent was obtained from the patient's family after a thorough discussion of the procedural risks, benefits and alternatives. Specific risks discussed include: Bleeding, infection, contrast reaction, kidney injury/failure, need for further procedure/surgery, arterial injury or dissection, embolization to new territory, intracranial hemorrhage (10-15% risk), neurologic deterioration, cardiopulmonary collapse, death. All questions were addressed. Maximal Sterile Barrier Technique was utilized including during the procedure including caps, mask, sterile gowns, sterile gloves, sterile drape, hand hygiene and skin antiseptic. A timeout was performed prior to the initiation of the procedure. The anesthesia team was present to provide general endotracheal tube anesthesia and for patient monitoring during the procedure. Interventional neuro radiology nursing staff was also present. FINDINGS: Initial Findings: Left common carotid artery:  Normal course caliber and contour. Left external carotid artery: Patent with antegrade flow. Left internal carotid artery: Normal course caliber and contour of the cervical portion. Vertical and petrous segment patent with normal course caliber contour. Cavernous segment patent. Clinoid segment patent. Antegrade flow of the ophthalmic artery. Ophthalmic segment patent. Terminus patent. Left MCA: No significant atherosclerotic changes are narrowing of the left MCA. There is a fairly early division/origin of the MCA, with an upgoing branch just beyond the lenticulostriate arteries, superior division. There is a orbital frontal or frontal polar branch in a downgoing  configuration. The dominant branch is the inferior division, which is apparently the dominant division given its supply to the parietal territory after reperfusion. Of note, there is an M4 occlusion of the early superior branch, which remains occluded throughout. Left ACA: A 1 segment patent. A 2 segment perfuses the right territory. Significant p.o. collaterals are identified, attempting to reperfuse the parietal territory and the temporal territory from the ACA distribution in the watershed area. Completion Findings: Left MCA: Complete reperfusion of the dominant inferior division of the M1 with no embolization new territory. Flat panel CT: No hemorrhage. There is stagnant focus of contrast associated with the M4 occlusion, within a separate arterial territory than the treated M1 segment. PROCEDURE: The anesthesia team was present to provide general endotracheal tube anesthesia and for patient monitoring during the procedure. Intubation was performed in negative pressure Bay in neuro IR holding. Interventional neuro radiology nursing staff was also present. Ultrasound survey of the right inguinal region was performed with images stored and sent to PACs. 11 blade scalpel was used to make a small incision. Blunt dissection was performed with US guidance. A micropuncture needle was used access the right common femoral artery under ultrasound. With excellent arterial blood flow returned, an .018 micro wire was passed through the needle, observed to enter the abdominal aorta under fluoroscopy. The needle was removed, and a micropuncture sheath was placed over the wire. The inner dilator and wire were removed, and an 035 wire was advanced under fluoroscopy into the abdominal aorta. The sheath was removed and a 25cm 30F straight vascular sheath was placed. The dilator was removed and the sheath was flushed. Sheath was attached to pressurized and heparinized saline bag for constant forward flow. A coaxial system was then  advanced over the 035 wire. This included  a 95cm 087 "Walrus" balloon guide with coaxial 125cm Berenstein diagnostic catheter. This was advanced to the proximal descending thoracic aorta. Wire was then removed. Double flush of the catheter was performed. Catheter was then used to select the left common carotid artery. Angiogram was performed. Using roadmap technique, the catheter was advanced over a standard glide wire into left cervical ICA, with distal position achieved of the balloon guide. The diagnostic catheter and the wire were removed. Formal angiogram was performed. Road map function was used once the occluded vessel was identified. Copious back flush was performed and the balloon catheter was attached to heparinized and pressurized saline bag for forward flow. A second coaxial system was then advanced through the balloon catheter, which included the selected intermediate catheter, microcatheter, and microwire. In this scenario, the set up included a 137cm zoom 71 intermediate catheter, a Trevo Pro18 microcatheter, and 014 synchro soft wire. This system was advanced through the balloon guide catheter under the road-map function, with adequate back-flush at the rotating hemostatic valve at that back end of the balloon guide. Microcatheter and the intermediate catheter system were advanced through the terminal ICA and MCA to the level of the occlusion. The micro wire was then carefully advanced up to the occluded segment. Intermediate catheter was then advanced on the microcatheter and wire combination, up to the proximal face of the thrombus. The microcatheter and microwire were then gently removed and the rotating hemostatic valve was removed from the intermediate catheter. Direct aspiration was then performed through the zoom catheter. Zoom catheter was then gently withdrawn with full aspiration, while manipulating the balloon guide more proximally within the cervical artery. Zoom catheter was slowly  withdrawn from the balloon guide. Angiogram was performed, confirming complete restoration of flow. Angiogram of the cervical ICA was performed. Balloon guide was then removed. The skin at the puncture site was then cleaned with Chlorhexidine. The 8 French sheath was removed and an 2F angioseal was deployed. Flat panel CT was performed. Patient was extubated once the CT was reviewed. Patient tolerated the procedure well and remained hemodynamically stable throughout. No complications were encountered and no significant blood loss encountered. IMPRESSION: Status post ultrasound guided access right common femoral artery for left-sided cervical/cerebral angiogram and mechanical thrombectomy of left distal M1 occlusion using direct aspiration technique and achieving complete reperfusion of the occluded vessel. Angio-Seal for hemostasis. PLAN: The patient will remain intubated, given his comorbidities ICU status Target systolic blood pressure of 120-140 Right hip straight time 6 hours Frequent neurovascular checks Repeat neurologic imaging with CT and/MRI at the discretion of neurology team Electronically Signed   By: Gilmer Mor D.O.   On: 09/07/2020 10:18   DG CHEST PORT 1 VIEW  Result Date: 09/09/2020 CLINICAL DATA:  Tachycardia EXAM: PORTABLE CHEST 1 VIEW COMPARISON:  09/08/2020 FINDINGS: Endotracheal tube and NG tube remain in place, unchanged. Cardiomegaly. Bilateral lower lobe airspace opacities, slightly worsened on the right since prior study. Possible small layering right effusion. No acute bony abnormality. IMPRESSION: Bibasilar airspace opacities, worsening on the right. Question small right effusion. Electronically Signed   By: Charlett Nose M.D.   On: 09/09/2020 10:54   DG CHEST PORT 1 VIEW  Result Date: 09/08/2020 CLINICAL DATA:  Infection EXAM: PORTABLE CHEST 1 VIEW COMPARISON:  None. FINDINGS: The heart size and mediastinal contours are mildly enlarged. ETT is 2.7 cm above the carina. NG tube is  seen coursing below the diaphragm. Mildly increased interstitial markings are seen throughout both lungs. Probable trace  left pleural effusion is seen. No acute osseous abnormality. IMPRESSION: ET tube and NG tube in satisfactory position. Mildly increased interstitial opacities, likely interstitial edema. Trace left pleural effusion. Electronically Signed   By: Jonna Clark M.D.   On: 09/08/2020 16:02   DG CHEST PORT 1 VIEW  Result Date: 09/08/2020 CLINICAL DATA:  Endotracheal tube placement. EXAM: PORTABLE CHEST 1 VIEW COMPARISON:  09/07/2020 FINDINGS: The endotracheal tube has tip 5.2 cm above the carina. Enteric tube courses into the stomach and off the film as tip is not visualized. Lungs are adequately inflated with persistent hazy bilateral perihilar opacification likely interstitial edema and less likely infection. No effusion. Mild stable cardiomegaly. Remainder of the exam is unchanged. IMPRESSION: 1. Persistent hazy bilateral perihilar opacification likely interstitial edema and less likely infection. 2. Tubes and lines as described. Electronically Signed   By: Elberta Fortis M.D.   On: 09/08/2020 14:45   DG CHEST PORT 1 VIEW  Result Date: 09/07/2020 CLINICAL DATA:  Respiratory distress. EXAM: PORTABLE CHEST 1 VIEW COMPARISON:  Oct 05, 2020 FINDINGS: ET tube tip is above the carina. Stable cardiomediastinal contours. Diffuse pulmonary vascular congestion. Interval worsening aeration to the left midlung and left base which may represent airspace disease and or pleural effusion. IMPRESSION: Interval worsening aeration to the left midlung and left base which may represent airspace disease and/or pleural effusion. Electronically Signed   By: Signa Kell M.D.   On: 09/07/2020 10:48   DG Abd Portable 1V  Result Date: 09/08/2020 CLINICAL DATA:  Enteric tube placement. EXAM: PORTABLE ABDOMEN - 1 VIEW COMPARISON:  Chest x-ray earlier same day. FINDINGS: Interval placement of enteric tube with tip and  side-port over the stomach in the left upper quadrant. Visualized bowel gas pattern is nonobstructive. Remainder of the exam is unchanged. IMPRESSION: Enteric tube with tip and side-port over the stomach in the left upper quadrant. Nonobstructive bowel gas pattern. Electronically Signed   By: Elberta Fortis M.D.   On: 09/08/2020 14:46   ECHOCARDIOGRAM COMPLETE  Result Date: 09/07/2020    ECHOCARDIOGRAM REPORT   Patient Name:   Terry Howell. Date of Exam: 09/07/2020 Medical Rec #:  [id number]          Height: Accession #:    [id number]         Weight:       275.6 lb Date of Birth:  07-09-1956         BSA:          2.500 m Patient Age:    63 years           BP:           122/62 mmHg Patient Gender: M                  HR:           120 bpm. Exam Location:  Inpatient Procedure: Intracardiac Opacification Agent and 2D Echo Indications:    Stroke 434.91 / I163.9  History:        Patient has no prior history of Echocardiogram examinations.                 CHF, Stroke; Risk Factors:Hypertension and Diabetes. Acute                 ischemic left MCA stroke , Acute Respiratory Failure.  Sonographer:    Jeryl Columbia Referring Phys: [id number] SUSHANTH R AROOR IMPRESSIONS  1. Left ventricular ejection fraction, by estimation, is 30  to 35%. The left ventricle has moderately decreased function. The left ventricle demonstrates regional wall motion abnormalities (see scoring diagram/findings for description). The left ventricular internal cavity size was mildly dilated. There is moderate left ventricular hypertrophy. Left ventricular diastolic parameters are indeterminate.  2. Right ventricular systolic function is normal. The right ventricular size is normal.  3. The mitral valve is degenerative. Mild mitral valve regurgitation. No evidence of mitral stenosis. Moderate mitral annular calcification.  4. The aortic valve is tricuspid. Aortic valve regurgitation is not visualized. Mild to moderate aortic valve  sclerosis/calcification is present, without any evidence of aortic stenosis.  5. The inferior vena cava is dilated in size with >50% respiratory variability, suggesting right atrial pressure of 8 mmHg. Conclusion(s)/Recommendation(s): No intracardiac source of embolism detected on this transthoracic study. A transesophageal echocardiogram is recommended to exclude cardiac source of embolism if clinically indicated. FINDINGS  Left Ventricle: Left ventricular ejection fraction, by estimation, is 30 to 35%. The left ventricle has moderately decreased function. The left ventricle demonstrates regional wall motion abnormalities. The left ventricular internal cavity size was mildly dilated. There is moderate left ventricular hypertrophy. Left ventricular diastolic parameters are indeterminate.  LV Wall Scoring: The mid and distal anterior septum, entire apex, and mid and distal inferior wall are akinetic. Right Ventricle: The right ventricular size is normal. No increase in right ventricular wall thickness. Right ventricular systolic function is normal. Left Atrium: Left atrial size was normal in size. Right Atrium: Right atrial size was normal in size. Pericardium: There is no evidence of pericardial effusion. Mitral Valve: The mitral valve is degenerative in appearance. Moderate mitral annular calcification. Mild mitral valve regurgitation. No evidence of mitral valve stenosis. Tricuspid Valve: The tricuspid valve is normal in structure. Tricuspid valve regurgitation is not demonstrated. No evidence of tricuspid stenosis. Aortic Valve: The aortic valve is tricuspid. Aortic valve regurgitation is not visualized. Mild to moderate aortic valve sclerosis/calcification is present, without any evidence of aortic stenosis. Pulmonic Valve: The pulmonic valve was normal in structure. Pulmonic valve regurgitation is not visualized. No evidence of pulmonic stenosis. Aorta: The aortic root is normal in size and structure. Venous:  The inferior vena cava is dilated in size with greater than 50% respiratory variability, suggesting right atrial pressure of 8 mmHg. IAS/Shunts: No atrial level shunt detected by color flow Doppler.  LEFT VENTRICLE PLAX 2D LVIDd:         5.70 cm  Diastology LVIDs:         4.20 cm  LV e' medial:    15.00 cm/s LV PW:         1.40 cm  LV E/e' medial:  7.5 LV IVS:        1.40 cm  LV e' lateral:   14.10 cm/s LVOT diam:     2.00 cm  LV E/e' lateral: 7.9 LVOT Area:     3.14 cm  RIGHT VENTRICLE RV S prime:     21.90 cm/s TAPSE (M-mode): 2.4 cm LEFT ATRIUM             Index       RIGHT ATRIUM           Index LA diam:        4.00 cm 1.60 cm/m  RA Area:     16.20 cm LA Vol (A2C):   68.8 ml 27.52 ml/m RA Volume:   44.70 ml  17.88 ml/m LA Vol (A4C):   95.9 ml 38.35 ml/m LA Biplane Vol: 85.2  ml 34.07 ml/m   AORTA Ao Root diam: 3.80 cm MITRAL VALVE MV Area (PHT): 5.97 cm     SHUNTS MV Decel Time: 127 msec     Systemic Diam: 2.00 cm MR Peak grad: 47.9 mmHg MR Vmax:      346.00 cm/s MV E velocity: 112.00 cm/s Donato Schultz MD Electronically signed by Donato Schultz MD Signature Date/Time: 09/07/2020/1:40:29 PM    Final    IR PERCUTANEOUS ART THROMBECTOMY/INFUSION INTRACRANIAL INC DIAG ANGIO  Result Date: 09/07/2020 INDICATION: 63-year-ol male presents for mechanical thrombectomy of left emergent large vessel occlusion involving the left M1 EXAM: ULTRASOUND-GUIDED ACCESS RIGHT COMMON FEMORAL ARTERY CERVICAL AND CEREBRAL ANGIOGRAM MECHANICAL THROMBECTOMY LEFT MCA ANGIO-SEAL FOR HEMOSTASIS COMPARISON:  CT imaging same day MEDICATIONS: None ANESTHESIA/SEDATION: The anesthesia team was present to provide general endotracheal tube anesthesia and for patient monitoring during the procedure. Intubation was performed in negative pressure Bay in neuro IR holding. Left radial arterial line was performed by the anesthesia team. Interventional neuro radiology nursing staff was also present. CONTRAST:  60 cc contrast FLUOROSCOPY TIME:   Fluoroscopy Time: 8 minutes 48 seconds (778 mGy). COMPLICATIONS: None TECHNIQUE: Informed written consent was obtained from the patient's family after a thorough discussion of the procedural risks, benefits and alternatives. Specific risks discussed include: Bleeding, infection, contrast reaction, kidney injury/failure, need for further procedure/surgery, arterial injury or dissection, embolization to new territory, intracranial hemorrhage (10-15% risk), neurologic deterioration, cardiopulmonary collapse, death. All questions were addressed. Maximal Sterile Barrier Technique was utilized including during the procedure including caps, mask, sterile gowns, sterile gloves, sterile drape, hand hygiene and skin antiseptic. A timeout was performed prior to the initiation of the procedure. The anesthesia team was present to provide general endotracheal tube anesthesia and for patient monitoring during the procedure. Interventional neuro radiology nursing staff was also present. FINDINGS: Initial Findings: Left common carotid artery:  Normal course caliber and contour. Left external carotid artery: Patent with antegrade flow. Left internal carotid artery: Normal course caliber and contour of the cervical portion. Vertical and petrous segment patent with normal course caliber contour. Cavernous segment patent. Clinoid segment patent. Antegrade flow of the ophthalmic artery. Ophthalmic segment patent. Terminus patent. Left MCA: No significant atherosclerotic changes are narrowing of the left MCA. There is a fairly early division/origin of the MCA, with an upgoing branch just beyond the lenticulostriate arteries, superior division. There is a orbital frontal or frontal polar branch in a downgoing configuration. The dominant branch is the inferior division, which is apparently the dominant division given its supply to the parietal territory after reperfusion. Of note, there is an M4 occlusion of the early superior branch, which  remains occluded throughout. Left ACA: A 1 segment patent. A 2 segment perfuses the right territory. Significant p.o. collaterals are identified, attempting to reperfuse the parietal territory and the temporal territory from the ACA distribution in the watershed area. Completion Findings: Left MCA: Complete reperfusion of the dominant inferior division of the M1 with no embolization new territory. Flat panel CT: No hemorrhage. There is stagnant focus of contrast associated with the M4 occlusion, within a separate arterial territory than the treated M1 segment. PROCEDURE: The anesthesia team was present to provide general endotracheal tube anesthesia and for patient monitoring during the procedure. Intubation was performed in negative pressure Bay in neuro IR holding. Interventional neuro radiology nursing staff was also present. Ultrasound survey of the right inguinal region was performed with images stored and sent to PACs. 11 blade scalpel was used to  make a small incision. Blunt dissection was performed with US guidance. A micropuncture needle was used access the right common femoral artery under ultrasound. With excellent arterial blood flow returned, an .018 micro wire was passed through the needle, observed to enter the abdominal aorta under fluoroscopy. The needle was removed, and a micropuncture sheath was placed over the wire. The inner dilator and wire were removed, and an 035 wire was advanced under fluoroscopy into the abdominal aorta. The sheath was removed and a 25cm 91F straight vascular sheath was placed. The dilator was removed and the sheath was flushed. Sheath was attached to pressurized and heparinized saline bag for constant forward flow. A coaxial system was then advanced over the 035 wire. This included a 95cm 087 "Walrus" balloon guide with coaxial 125cm Berenstein diagnostic catheter. This was advanced to the proximal descending thoracic aorta. Wire was then removed. Double flush of the  catheter was performed. Catheter was then used to select the left common carotid artery. Angiogram was performed. Using roadmap technique, the catheter was advanced over a standard glide wire into left cervical ICA, with distal position achieved of the balloon guide. The diagnostic catheter and the wire were removed. Formal angiogram was performed. Road map function was used once the occluded vessel was identified. Copious back flush was performed and the balloon catheter was attached to heparinized and pressurized saline bag for forward flow. A second coaxial system was then advanced through the balloon catheter, which included the selected intermediate catheter, microcatheter, and microwire. In this scenario, the set up included a 137cm zoom 71 intermediate catheter, a Trevo Pro18 microcatheter, and 014 synchro soft wire. This system was advanced through the balloon guide catheter under the road-map function, with adequate back-flush at the rotating hemostatic valve at that back end of the balloon guide. Microcatheter and the intermediate catheter system were advanced through the terminal ICA and MCA to the level of the occlusion. The micro wire was then carefully advanced up to the occluded segment. Intermediate catheter was then advanced on the microcatheter and wire combination, up to the proximal face of the thrombus. The microcatheter and microwire were then gently removed and the rotating hemostatic valve was removed from the intermediate catheter. Direct aspiration was then performed through the zoom catheter. Zoom catheter was then gently withdrawn with full aspiration, while manipulating the balloon guide more proximally within the cervical artery. Zoom catheter was slowly withdrawn from the balloon guide. Angiogram was performed, confirming complete restoration of flow. Angiogram of the cervical ICA was performed. Balloon guide was then removed. The skin at the puncture site was then cleaned with  Chlorhexidine. The 8 French sheath was removed and an 91F angioseal was deployed. Flat panel CT was performed. Patient was extubated once the CT was reviewed. Patient tolerated the procedure well and remained hemodynamically stable throughout. No complications were encountered and no significant blood loss encountered. IMPRESSION: Status post ultrasound guided access right common femoral artery for left-sided cervical/cerebral angiogram and mechanical thrombectomy of left distal M1 occlusion using direct aspiration technique and achieving complete reperfusion of the occluded vessel. Angio-Seal for hemostasis. PLAN: The patient will remain intubated, given his comorbidities ICU status Target systolic blood pressure of 120-140 Right hip straight time 6 hours Frequent neurovascular checks Repeat neurologic imaging with CT and/MRI at the discretion of neurology team Electronically Signed   By: Gilmer Mor D.O.   On: 09/07/2020 10:18    PHYSICAL EXAM    Temp:  [99.9 F (37.7 C)-100.8 F (  38.2 C)] 100.8 F (38.2 C) (09/20 1300) Pulse Rate:  [77-112] 100 (09/20 1300) Resp:  [15-31] 20 (09/20 1300) BP: (95-139)/(59-81) 129/74 (09/20 1300) SpO2:  [96 %-100 %] 98 % (09/20 1300) FiO2 (%):  [40 %] 40 % (09/20 1200) Weight:  [124.4 kg] 124.4 kg (09/20 0500)  General - Well nourished, well developed, intubated on low dose propofol.  Ophthalmologic - fundi not visualized due to noncooperation.  Cardiovascular - Regular rate and rhythm.  Neuro - intubated on low dose propofol, eyes open, but not following commands. Eyes in mid position, however, with left gaze preference. Inconsistently blinking to visual threat bilaterally, doll's eyes present, PERRL. Corneal reflex present, gag and cough present. Breathing over the vent on weaning trial.  Facial symmetry not able to test due to ET tube.  Tongue protrusion not cooperative. Spontaneously moving LUE against gravity. On pain stimulation, RUE flaccid and LLE  withdraw more than RLE withdraw, both did not against gravity. DTR 1+ and no babinski. Sensation, coordination not cooperative and gait not tested.   ASSESSMENT/PLAN Mr. Rennis Chrisrvin C Kopinski Jr. is a 64 y.o. male with history of hypertension, hyperlipidemia, uncontrolled insulin-dependent diabetes mellitus, history of coronary artery disease status post PCI, heart failure presented [facility] ED [facility] with sudden onset aphasia and right-sided weakness. Tele-neurology consult -> [facility]. [facility]. 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 Occult afib  CT Head - no acute finding     CTA H&N - OSH - Lt M1 occlusion   IR left M1 occlusion with TICI3 reperfusion  MRI head - Left lenticulostriate territory infarcts with patchy petechial hemorrhage and mildly expanded left basal ganglia. Elsewhere there are a few scattered punctate and occasionally patchy infarcts in the Left MCA territory.  MRA head - Left MCA patent  2D Echo EF 30-35%  Cardiology recommend outpt cardiac monitoring to rule out afib  EEG no seizure  [employer] Corona Virus 2 - neg OSH  LDL - 50  HgbA1c - 8.7  UDS - neg  VTE prophylaxis - SCDs  No antithrombotic prior to admission, now on ASA 325 via OG  Therapy recommendations:  CIR  Disposition:  Pending  Respiratory failure d/t pulmonary edema  Intubated for procedure  Continue to be intubated postprocedure  Failed the weaning trials x 2  CCM on board  On diuresis  Hope to extubate today  Cardiomyopathy Acute Systolic CHF Hx of CAD s/p stent 17 years ago  Has not followed with cardiology  TTE EF 30-35%  Could be related to current stroke  On lasix 40 Q12h  CXR 9/19 mild edema   CXR 9/20 mild R>L airspace opacity, ? R effusion  Cardiology onboard -  recommend outpt cardiac monitoring to rule out afib  Hx of hypertension Hypotension  Home BP meds: none   Current BP meds: none   Off Neo now  BP stable  On diuresis per CCM . Long-term BP goal normotensive  Hyperlipidemia  Home Lipid lowering medication: none   LDL 50, goal < 70  On Pravachol 20  Continue statin at discharge  Diabetes  Home diabetic meds: none   HgbA1c 8.7, goal < 7.0  SSI  CBG monitoring  Hyperglycemia 300s  Put on lantus 30 qd   Close PCP  follow up for better DM control  Leukocytosis and fever  WBC 19.4->20.6->17.6   Tmax 101.1  UA neg  CXR 9/19 mild edema   CXR 9/20 mild R>L airspace opacity, ? R effusion  Dysphagia   Intubated now  On OG tube now  On diuresis  On tube feeding via OG  Speech to follow after extubation  Other Stroke Risk Factors  Advanced age  Coronary artery disease  Congestive Heart Failure  Other Active Problems  Code status - DNR  Anemia - Hgb - 10.5->10.4->10.3  Decreased pedal pulses, ck ABIs    On Desyrel 100 hs   Hospital day # 3  This patient is critically ill due to respiratory failure on ventilation, left MCA stroke, cardiomyopathy, fever and leukocytosis, dysphagia and at significant risk of neurological worsening, death form recurrent stroke, hemorrhagic conversion, heart failure, cardiac arrest, sepsis, aspiration, seizure. This patient's care requires constant monitoring of vital signs, hemodynamics, respiratory and cardiac monitoring, review of multiple databases, neurological assessment, discussion with family, other specialists and medical decision making of high complexity. I spent 35 minutes of neurocritical care time in the care of this patient.  I discussed with Dr. Judeth Horn.    Marvel Plan, MD PhD Stroke Neurology 09/09/2020 2:01 PM    To contact Stroke Continuity provider, please refer to [email or web address]. After hours, contact General Neurology

## 2020-09-09 NOTE — Progress Notes (Signed)
Initial Nutrition Assessment  DOCUMENTATION CODES:   Obesity unspecified  INTERVENTION:   Tube feeding via OG tube: Vital High Protein at 50 ml/h (1200 ml per day) Prosource TF 90 ml TID  Provides 1440 kcal, 171 gm protein, 1003 ml free water daily  TF regimen and propofol at current rate providing 1638 total kcal/day   NUTRITION DIAGNOSIS:   Inadequate oral intake related to inability to eat as evidenced by NPO status.  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   Pt discussed during ICU rounds and with RN.  Cardiology following for acute systolic CHF with EF 30-35% and pulmonary edema  Patient is currently intubated on ventilator support MV: 13.1 L/min Temp (24hrs), Avg:100.2 F (37.9 C), Min:99.9 F (37.7 C), Max:101.1 F (38.4 C)  Propofol: 7.5 ml/hr provides: 198 kcal  Medications reviewed and include: SSI, 30 units lantus daily,  Labs reviewed: hgbA1C: 8.7 CBG's: 340-370-354   57F OG tube; tip gastric  NUTRITION - FOCUSED PHYSICAL EXAM:    Most Recent Value  Orbital Region No depletion  Upper Arm Region No depletion  Thoracic and Lumbar Region No depletion  Buccal Region No depletion  Temple Region No depletion  Clavicle Bone Region No depletion  Clavicle and Acromion Bone Region No depletion  Scapular Bone Region No depletion  Dorsal Hand No depletion  Patellar Region No depletion  Anterior Thigh Region No depletion  Posterior Calf Region No depletion  Edema (RD Assessment) Mild  Hair Reviewed  Eyes Reviewed  Mouth Unable to assess  Skin Reviewed  Nails Reviewed       Diet Order:   Diet Order            Diet NPO time specified  Diet  effective now                 EDUCATION NEEDS:   No education needs have been identified at this time  Skin:  Skin Assessment: Reviewed RN Assessment  Last BM:  9/20 large x 2  Height:   Ht Readings from Last 1 Encounters:  No data found for Ht    Weight:   Wt Readings from Last 1 Encounters:  09/09/20 124.4 kg    Ideal Body Weight:     BMI:  There is no height or weight on file to calculate BMI.  Estimated Nutritional Needs:   Kcal:  1350-1750  Protein:  >161 grams  Fluid:  > 1.5 L/day  Cammy Copa., RD, LDN, CNSC See AMiON for contact information

## 2020-09-09 NOTE — Progress Notes (Addendum)
Progress Note  Patient Name: Terry Howell. Date of Encounter: 09/09/2020  Primary Cardiologist: New, lives in [place], f/u [facility] No primary care provider on file.  Subjective   Awake on the vent, no obvious distress, not able to respond to questions  Inpatient Medications    Scheduled Meds: .  stroke: mapping our early stages of recovery book   Does not apply Once  . aspirin  300 mg Rectal Daily  . chlorhexidine gluconate (MEDLINE KIT)  15 mL Mouth Rinse BID  . Chlorhexidine Gluconate Cloth  6 each Topical Daily  . feeding supplement (PROSource TF)  45 mL Per Tube BID  . feeding supplement (VITAL HIGH PROTEIN)  1,000 mL Per Tube Q24H  . influenza vac split quadrivalent PF  0.5 mL Intramuscular Tomorrow-1000  . insulin aspart  0-15 Units Subcutaneous Q4H  . mouth rinse  15 mL Mouth Rinse 10 times per day  . pantoprazole (PROTONIX) IV  40 mg Intravenous QHS  . pneumococcal 23 valent vaccine  0.5 mL Intramuscular Tomorrow-1000   Continuous Infusions: . propofol (DIPRIVAN) infusion 25 mcg/kg/min (09/09/20 0700)   PRN Meds: acetaminophen **OR** acetaminophen (TYLENOL) oral liquid 160 mg/5 mL **OR** acetaminophen, fentaNYL (SUBLIMAZE) injection, senna-docusate   Vital Signs    Vitals:   09/09/20 0500 09/09/20 0600 09/09/20 0700 09/09/20 0730  BP: 109/65 112/65 115/64   Pulse: 85 90 90 86  Resp: $Remo'18 19 20 'CJcKQ$ (!) 25  Temp: 100 F (37.8 C) 100 F (37.8 C) 99.9 F (37.7 C)   TempSrc:      SpO2: 100% 100% 100% 100%  Weight: 124.4 kg       Intake/Output Summary (Last 24 hours) at 09/09/2020 0843 Last data filed at 09/09/2020 0700 Gross per 24 hour  Intake 1298.14 ml  Output 1750 ml  Net -451.86 ml   Filed Weights   09/13/2020 2315 09/09/20 0500  Weight: 125 kg 124.4 kg   Last Weight  Most recent update: 09/09/2020  5:22 AM   Weight  124.4 kg (274 lb 4 oz)           Weight change:    Telemetry    SR, PVCs - Personally Reviewed  ECG    None today -  Personally Reviewed  Physical Exam   General: Well developed, well nourished, male appearing comfortable on the vent. Head: Normocephalic, atraumatic.  Neck: Supple without bruits, JVD not able to assess. Lungs:  Resp regular and unlabored, CTA anteriorly. Heart: RRR, S1, S2, no S3, S4, or murmur; no rub. Abdomen: Soft, non-tender, non-distended with normoactive bowel sounds. No hepatomegaly. No rebound/guarding. No obvious abdominal masses. Extremities: No clubbing, cyanosis, no edema. Distal pedal pulses are decreased bilaterally. Cap refill delayed Neuro: Awake on the vent, receiving sedation   Labs    Hematology Recent Labs  Lab 09/07/20 0935 09/08/20 0706 09/09/20 0357  WBC 19.4* 20.6* 17.6*  RBC 3.32* 3.17* 3.20*  HGB 10.5* 10.4* 10.3*  HCT 33.5* 32.9* 32.9*  MCV 100.9* 103.8* 102.8*  MCH 31.6 32.8 32.2  MCHC 31.3 31.6 31.3  RDW 14.7 14.6 14.6  PLT 197 185 186    Chemistry Recent Labs  Lab 09/07/20 0935 09/08/20 0706 09/09/20 0357  NA 141 141 142  K 3.7 3.8 3.8  CL 107 108 109  CO2 22 20* 22  GLUCOSE 162* 213* 378*  BUN 34* 31* 39*  CREATININE 1.11 1.10 1.14  CALCIUM 8.4* 8.1* 8.2*  GFRNONAA >60 >60 >60  GFRAA >60 >60 >  60  ANIONGAP $RemoveB'12 13 11     'cTjXNiLm$ High Sensitivity Troponin:  No results for input(s): TROPONINIHS in the last 720 hours.    BNP Recent Labs  Lab 09/08/20 0706  BNP 611.1*    Lab Results  Component Value Date   CHOL 97 09/07/2020   HDL 30 (L) 09/07/2020   LDLCALC 50 09/07/2020   TRIG 86 09/07/2020   CHOLHDL 3.2 09/07/2020    DDimer No results for input(s): DDIMER in the last 168 hours.   Radiology    MR ANGIO HEAD WO CONTRAST  Result Date: 09/07/2020 CLINICAL DATA:  64 year old male code stroke presentation yesterday with left MCA ELVO at [facility]. Status post IV tPA and endovascular revascularization. EXAM: MRI HEAD WITHOUT CONTRAST MRA HEAD WITHOUT CONTRAST TECHNIQUE: Multiplanar, multiecho pulse sequences of the brain  and surrounding structures were obtained without intravenous contrast. Angiographic images of the head were obtained using MRA technique without contrast. COMPARISON:  [facility] CT head and CTA head and neck yesterday. FINDINGS: MRI HEAD FINDINGS Brain: Patchy restricted diffusion and petechial hemorrhage in the left basal ganglia which are mildly expanded (series 6, image 85, series 14, image 32). Heidelberg Classification type 1c vs. type 2 petechial blood. Mild additional mostly punctate but occasional patchy scattered restricted diffusion in the superior left temporal lobe, posterior left insula. No additional blood products. No contralateral or posterior fossa restricted diffusion. No midline shift, ventriculomegaly, extra-axial collection. Outside of the acute findings the gray and white matter signal is largely normal for age. Cervicomedullary junction and pituitary are within normal limits. Vascular: Major intracranial vascular flow voids are preserved. Skull and upper cervical spine: Negative for age visible cervical spine. Visualized bone marrow signal is within normal limits. Sinuses/Orbits: Postoperative changes to both globes. Minor ethmoid and sphenoid sinus disease. Other: Intubated. Small volume of fluid layering in the pharynx. Mastoids are clear. Visible internal auditory structures appear normal. MRA HEAD FINDINGS Antegrade flow in the posterior circulation with mildly dominant left vertebral artery. No distal vertebral or basilar stenosis. Patent PICA origins, AICA, SCA and PCA origins. Posterior communicating arteries are diminutive or absent. Bilateral PCA branches are within normal limits. Antegrade flow in both ICA siphons. No siphon stenosis. Normal ophthalmic artery origins. Patent carotid termini. Patent MCA and ACA origins. Anterior communicating artery, visible ACA branches, right MCA M1 and visible right MCA branches are within normal limits. Patent left MCA M1 and bifurcation  now. No significant vessel irregularity. Visible left MCA branches are within normal limits. IMPRESSION: 1. Left lenticulostriate territory infarcts with patchy petechial hemorrhage and mildly expanded left basal ganglia. No significant intracranial mass effect. Elsewhere there are a few scattered punctate and occasionally patchy infarcts in the Left MCA territory. 2. Normal Left MCA and negative intracranial MRA. 3. Outside of the left MCA territory negative for age MRI appearance of the brain. Electronically Signed   By: Genevie Ann M.D.   On: 09/07/2020 19:22   MR BRAIN WO CONTRAST  Result Date: 09/07/2020 CLINICAL DATA:  64 year old male code stroke presentation yesterday with left MCA ELVO at [facility]. Status post IV tPA and endovascular revascularization. EXAM: MRI HEAD WITHOUT CONTRAST MRA HEAD WITHOUT CONTRAST TECHNIQUE: Multiplanar, multiecho pulse sequences of the brain and surrounding structures were obtained without intravenous contrast. Angiographic images of the head were obtained using MRA technique without contrast. COMPARISON:  [facility] CT head and CTA head and neck yesterday. FINDINGS: MRI HEAD FINDINGS Brain: Patchy restricted diffusion and petechial hemorrhage in the  left basal ganglia which are mildly expanded (series 6, image 85, series 14, image 32). Heidelberg Classification type 1c vs. type 2 petechial blood. Mild additional mostly punctate but occasional patchy scattered restricted diffusion in the superior left temporal lobe, posterior left insula. No additional blood products. No contralateral or posterior fossa restricted diffusion. No midline shift, ventriculomegaly, extra-axial collection. Outside of the acute findings the gray and white matter signal is largely normal for age. Cervicomedullary junction and pituitary are within normal limits. Vascular: Major intracranial vascular flow voids are preserved. Skull and upper cervical spine: Negative for age visible  cervical spine. Visualized bone marrow signal is within normal limits. Sinuses/Orbits: Postoperative changes to both globes. Minor ethmoid and sphenoid sinus disease. Other: Intubated. Small volume of fluid layering in the pharynx. Mastoids are clear. Visible internal auditory structures appear normal. MRA HEAD FINDINGS Antegrade flow in the posterior circulation with mildly dominant left vertebral artery. No distal vertebral or basilar stenosis. Patent PICA origins, AICA, SCA and PCA origins. Posterior communicating arteries are diminutive or absent. Bilateral PCA branches are within normal limits. Antegrade flow in both ICA siphons. No siphon stenosis. Normal ophthalmic artery origins. Patent carotid termini. Patent MCA and ACA origins. Anterior communicating artery, visible ACA branches, right MCA M1 and visible right MCA branches are within normal limits. Patent left MCA M1 and bifurcation now. No significant vessel irregularity. Visible left MCA branches are within normal limits. IMPRESSION: 1. Left lenticulostriate territory infarcts with patchy petechial hemorrhage and mildly expanded left basal ganglia. No significant intracranial mass effect. Elsewhere there are a few scattered punctate and occasionally patchy infarcts in the Left MCA territory. 2. Normal Left MCA and negative intracranial MRA. 3. Outside of the left MCA territory negative for age MRI appearance of the brain. Electronically Signed   By: Genevie Ann M.D.   On: 09/07/2020 19:22   IR CT Head Ltd  Result Date: 09/07/2020 INDICATION: 50-year-ol male presents for mechanical thrombectomy of left emergent large vessel occlusion involving the left M1 EXAM: ULTRASOUND-GUIDED ACCESS RIGHT COMMON FEMORAL ARTERY CERVICAL AND CEREBRAL ANGIOGRAM MECHANICAL THROMBECTOMY LEFT MCA ANGIO-SEAL FOR HEMOSTASIS COMPARISON:  CT imaging same day MEDICATIONS: None ANESTHESIA/SEDATION: The anesthesia team was present to provide general endotracheal tube anesthesia  and for patient monitoring during the procedure. Intubation was performed in negative pressure Bay in neuro IR holding. Left radial arterial line was performed by the anesthesia team. Interventional neuro radiology nursing staff was also present. CONTRAST:  60 cc contrast FLUOROSCOPY TIME:  Fluoroscopy Time: 8 minutes 48 seconds (778 mGy). COMPLICATIONS: None TECHNIQUE: Informed written consent was obtained from the patient's family after a thorough discussion of the procedural risks, benefits and alternatives. Specific risks discussed include: Bleeding, infection, contrast reaction, kidney injury/failure, need for further procedure/surgery, arterial injury or dissection, embolization to new territory, intracranial hemorrhage (10-15% risk), neurologic deterioration, cardiopulmonary collapse, death. All questions were addressed. Maximal Sterile Barrier Technique was utilized including during the procedure including caps, mask, sterile gowns, sterile gloves, sterile drape, hand hygiene and skin antiseptic. A timeout was performed prior to the initiation of the procedure. The anesthesia team was present to provide general endotracheal tube anesthesia and for patient monitoring during the procedure. Interventional neuro radiology nursing staff was also present. FINDINGS: Initial Findings: Left common carotid artery:  Normal course caliber and contour. Left external carotid artery: Patent with antegrade flow. Left internal carotid artery: Normal course caliber and contour of the cervical portion. Vertical and petrous segment patent with normal course caliber contour. Cavernous  segment patent. Clinoid segment patent. Antegrade flow of the ophthalmic artery. Ophthalmic segment patent. Terminus patent. Left MCA: No significant atherosclerotic changes are narrowing of the left MCA. There is a fairly early division/origin of the MCA, with an upgoing branch just beyond the lenticulostriate arteries, superior division. There is  a orbital frontal or frontal polar branch in a downgoing configuration. The dominant branch is the inferior division, which is apparently the dominant division given its supply to the parietal territory after reperfusion. Of note, there is an M4 occlusion of the early superior branch, which remains occluded throughout. Left ACA: A 1 segment patent. A 2 segment perfuses the right territory. Significant p.o. collaterals are identified, attempting to reperfuse the parietal territory and the temporal territory from the [place] distribution in the watershed area. Completion Findings: Left MCA: Complete reperfusion of the dominant inferior division of the M1 with no embolization new territory. Flat panel CT: No hemorrhage. There is stagnant focus of contrast associated with the M4 occlusion, within a separate arterial territory than the treated M1 segment. PROCEDURE: The anesthesia team was present to provide general endotracheal tube anesthesia and for patient monitoring during the procedure. Intubation was performed in negative pressure Bay in neuro IR holding. Interventional neuro radiology nursing staff was also present. Ultrasound survey of the right inguinal region was performed with images stored and sent to PACs. 11 blade scalpel was used to make a small incision. Blunt dissection was performed with US guidance. A micropuncture needle was used access the right common femoral artery under ultrasound. With excellent arterial blood flow returned, an .018 micro wire was passed through the needle, observed to enter the abdominal aorta under fluoroscopy. The needle was removed, and a micropuncture sheath was placed over the wire. The inner dilator and wire were removed, and an 035 wire was advanced under fluoroscopy into the abdominal aorta. The sheath was removed and a 25cm 75F straight vascular sheath was placed. The dilator was removed and the sheath was flushed. Sheath was attached to pressurized and heparinized saline  bag for constant forward flow. A coaxial system was then advanced over the 035 wire. This included a 95cm 087 "Walrus" balloon guide with coaxial 125cm Berenstein diagnostic catheter. This was advanced to the proximal descending thoracic aorta. Wire was then removed. Double flush of the catheter was performed. Catheter was then used to select the left common carotid artery. Angiogram was performed. Using roadmap technique, the catheter was advanced over a standard glide wire into left cervical ICA, with distal position achieved of the balloon guide. The diagnostic catheter and the wire were removed. Formal angiogram was performed. Road map function was used once the occluded vessel was identified. Copious back flush was performed and the balloon catheter was attached to heparinized and pressurized saline bag for forward flow. A second coaxial system was then advanced through the balloon catheter, which included the selected intermediate catheter, microcatheter, and microwire. In this scenario, the set up included a 137cm zoom 71 intermediate catheter, a Trevo Pro18 microcatheter, and 014 synchro soft wire. This system was advanced through the balloon guide catheter under the road-map function, with adequate back-flush at the rotating hemostatic valve at that back end of the balloon guide. Microcatheter and the intermediate catheter system were advanced through the terminal ICA and MCA to the level of the occlusion. The micro wire was then carefully advanced up to the occluded segment. Intermediate catheter was then advanced on the microcatheter and wire combination, up to the proximal face  of the thrombus. The microcatheter and microwire were then gently removed and the rotating hemostatic valve was removed from the intermediate catheter. Direct aspiration was then performed through the zoom catheter. Zoom catheter was then gently withdrawn with full aspiration, while manipulating the balloon guide more proximally  within the cervical artery. Zoom catheter was slowly withdrawn from the balloon guide. Angiogram was performed, confirming complete restoration of flow. Angiogram of the cervical ICA was performed. Balloon guide was then removed. The skin at the puncture site was then cleaned with Chlorhexidine. The 8 French sheath was removed and an 24F angioseal was deployed. Flat panel CT was performed. Patient was extubated once the CT was reviewed. Patient tolerated the procedure well and remained hemodynamically stable throughout. No complications were encountered and no significant blood loss encountered. IMPRESSION: Status post ultrasound guided access right common femoral artery for left-sided cervical/cerebral angiogram and mechanical thrombectomy of left distal M1 occlusion using direct aspiration technique and achieving complete reperfusion of the occluded vessel. Angio-Seal for hemostasis. PLAN: The patient will remain intubated, given his comorbidities ICU status Target systolic blood pressure of 120-140 Right hip straight time 6 hours Frequent neurovascular checks Repeat neurologic imaging with CT and/MRI at the discretion of neurology team Electronically Signed   By: Gilmer Mor D.O.   On: 09/07/2020 10:18   IR US Guide Vasc Access Right  Result Date: 09/07/2020 INDICATION: 63-year-ol male presents for mechanical thrombectomy of left emergent large vessel occlusion involving the left M1 EXAM: ULTRASOUND-GUIDED ACCESS RIGHT COMMON FEMORAL ARTERY CERVICAL AND CEREBRAL ANGIOGRAM MECHANICAL THROMBECTOMY LEFT MCA ANGIO-SEAL FOR HEMOSTASIS COMPARISON:  CT imaging same day MEDICATIONS: None ANESTHESIA/SEDATION: The anesthesia team was present to provide general endotracheal tube anesthesia and for patient monitoring during the procedure. Intubation was performed in negative pressure Bay in neuro IR holding. Left radial arterial line was performed by the anesthesia team. Interventional neuro radiology nursing staff was  also present. CONTRAST:  60 cc contrast FLUOROSCOPY TIME:  Fluoroscopy Time: 8 minutes 48 seconds (778 mGy). COMPLICATIONS: None TECHNIQUE: Informed written consent was obtained from the patient's family after a thorough discussion of the procedural risks, benefits and alternatives. Specific risks discussed include: Bleeding, infection, contrast reaction, kidney injury/failure, need for further procedure/surgery, arterial injury or dissection, embolization to new territory, intracranial hemorrhage (10-15% risk), neurologic deterioration, cardiopulmonary collapse, death. All questions were addressed. Maximal Sterile Barrier Technique was utilized including during the procedure including caps, mask, sterile gowns, sterile gloves, sterile drape, hand hygiene and skin antiseptic. A timeout was performed prior to the initiation of the procedure. The anesthesia team was present to provide general endotracheal tube anesthesia and for patient monitoring during the procedure. Interventional neuro radiology nursing staff was also present. FINDINGS: Initial Findings: Left common carotid artery:  Normal course caliber and contour. Left external carotid artery: Patent with antegrade flow. Left internal carotid artery: Normal course caliber and contour of the cervical portion. Vertical and petrous segment patent with normal course caliber contour. Cavernous segment patent. Clinoid segment patent. Antegrade flow of the ophthalmic artery. Ophthalmic segment patent. Terminus patent. Left MCA: No significant atherosclerotic changes are narrowing of the left MCA. There is a fairly early division/origin of the MCA, with an upgoing branch just beyond the lenticulostriate arteries, superior division. There is a orbital frontal or frontal polar branch in a downgoing configuration. The dominant branch is the inferior division, which is apparently the dominant division given its supply to the parietal territory after reperfusion. Of note,  there is an M4  occlusion of the early superior branch, which remains occluded throughout. Left ACA: A 1 segment patent. A 2 segment perfuses the right territory. Significant p.o. collaterals are identified, attempting to reperfuse the parietal territory and the temporal territory from the [place] distribution in the watershed area. Completion Findings: Left MCA: Complete reperfusion of the dominant inferior division of the M1 with no embolization new territory. Flat panel CT: No hemorrhage. There is stagnant focus of contrast associated with the M4 occlusion, within a separate arterial territory than the treated M1 segment. PROCEDURE: The anesthesia team was present to provide general endotracheal tube anesthesia and for patient monitoring during the procedure. Intubation was performed in negative pressure Bay in neuro IR holding. Interventional neuro radiology nursing staff was also present. Ultrasound survey of the right inguinal region was performed with images stored and sent to PACs. 11 blade scalpel was used to make a small incision. Blunt dissection was performed with US guidance. A micropuncture needle was used access the right common femoral artery under ultrasound. With excellent arterial blood flow returned, an .018 micro wire was passed through the needle, observed to enter the abdominal aorta under fluoroscopy. The needle was removed, and a micropuncture sheath was placed over the wire. The inner dilator and wire were removed, and an 035 wire was advanced under fluoroscopy into the abdominal aorta. The sheath was removed and a 25cm 53F straight vascular sheath was placed. The dilator was removed and the sheath was flushed. Sheath was attached to pressurized and heparinized saline bag for constant forward flow. A coaxial system was then advanced over the 035 wire. This included a 95cm 087 "Walrus" balloon guide with coaxial 125cm Berenstein diagnostic catheter. This was advanced to the proximal descending  thoracic aorta. Wire was then removed. Double flush of the catheter was performed. Catheter was then used to select the left common carotid artery. Angiogram was performed. Using roadmap technique, the catheter was advanced over a standard glide wire into left cervical ICA, with distal position achieved of the balloon guide. The diagnostic catheter and the wire were removed. Formal angiogram was performed. Road map function was used once the occluded vessel was identified. Copious back flush was performed and the balloon catheter was attached to heparinized and pressurized saline bag for forward flow. A second coaxial system was then advanced through the balloon catheter, which included the selected intermediate catheter, microcatheter, and microwire. In this scenario, the set up included a 137cm zoom 71 intermediate catheter, a Trevo Pro18 microcatheter, and 014 synchro soft wire. This system was advanced through the balloon guide catheter under the road-map function, with adequate back-flush at the rotating hemostatic valve at that back end of the balloon guide. Microcatheter and the intermediate catheter system were advanced through the terminal ICA and MCA to the level of the occlusion. The micro wire was then carefully advanced up to the occluded segment. Intermediate catheter was then advanced on the microcatheter and wire combination, up to the proximal face of the thrombus. The microcatheter and microwire were then gently removed and the rotating hemostatic valve was removed from the intermediate catheter. Direct aspiration was then performed through the zoom catheter. Zoom catheter was then gently withdrawn with full aspiration, while manipulating the balloon guide more proximally within the cervical artery. Zoom catheter was slowly withdrawn from the balloon guide. Angiogram was performed, confirming complete restoration of flow. Angiogram of the cervical ICA was performed. Balloon guide was then removed.  The skin at the puncture site was then cleaned  with Chlorhexidine. The 8 French sheath was removed and an 61F angioseal was deployed. Flat panel CT was performed. Patient was extubated once the CT was reviewed. Patient tolerated the procedure well and remained hemodynamically stable throughout. No complications were encountered and no significant blood loss encountered. IMPRESSION: Status post ultrasound guided access right common femoral artery for left-sided cervical/cerebral angiogram and mechanical thrombectomy of left distal M1 occlusion using direct aspiration technique and achieving complete reperfusion of the occluded vessel. Angio-Seal for hemostasis. PLAN: The patient will remain intubated, given his comorbidities ICU status Target systolic blood pressure of 120-140 Right hip straight time 6 hours Frequent neurovascular checks Repeat neurologic imaging with CT and/MRI at the discretion of neurology team Electronically Signed   By: Corrie Mckusick D.O.   On: 09/07/2020 10:18   DG CHEST PORT 1 VIEW  Result Date: 09/08/2020 CLINICAL DATA:  Infection EXAM: PORTABLE CHEST 1 VIEW COMPARISON:  None. FINDINGS: The heart size and mediastinal contours are mildly enlarged. ETT is 2.7 cm above the carina. NG tube is seen coursing below the diaphragm. Mildly increased interstitial markings are seen throughout both lungs. Probable trace left pleural effusion is seen. No acute osseous abnormality. IMPRESSION: ET tube and NG tube in satisfactory position. Mildly increased interstitial opacities, likely interstitial edema. Trace left pleural effusion. Electronically Signed   By: Prudencio Pair M.D.   On: 09/08/2020 16:02   DG CHEST PORT 1 VIEW  Result Date: 09/08/2020 CLINICAL DATA:  Endotracheal tube placement. EXAM: PORTABLE CHEST 1 VIEW COMPARISON:  09/07/2020 FINDINGS: The endotracheal tube has tip 5.2 cm above the carina. Enteric tube courses into the stomach and off the film as tip is not visualized. Lungs are  adequately inflated with persistent hazy bilateral perihilar opacification likely interstitial edema and less likely infection. No effusion. Mild stable cardiomegaly. Remainder of the exam is unchanged. IMPRESSION: 1. Persistent hazy bilateral perihilar opacification likely interstitial edema and less likely infection. 2. Tubes and lines as described. Electronically Signed   By: Marin Olp M.D.   On: 09/08/2020 14:45   DG CHEST PORT 1 VIEW  Result Date: 09/07/2020 CLINICAL DATA:  Respiratory distress. EXAM: PORTABLE CHEST 1 VIEW COMPARISON:  09/19/2020 FINDINGS: ET tube tip is above the carina. Stable cardiomediastinal contours. Diffuse pulmonary vascular congestion. Interval worsening aeration to the left midlung and left base which may represent airspace disease and or pleural effusion. IMPRESSION: Interval worsening aeration to the left midlung and left base which may represent airspace disease and/or pleural effusion. Electronically Signed   By: Kerby Moors M.D.   On: 09/07/2020 10:48   DG Abd Portable 1V  Result Date: 09/08/2020 CLINICAL DATA:  Enteric tube placement. EXAM: PORTABLE ABDOMEN - 1 VIEW COMPARISON:  Chest x-ray earlier same day. FINDINGS: Interval placement of enteric tube with tip and side-port over the stomach in the left upper quadrant. Visualized bowel gas pattern is nonobstructive. Remainder of the exam is unchanged. IMPRESSION: Enteric tube with tip and side-port over the stomach in the left upper quadrant. Nonobstructive bowel gas pattern. Electronically Signed   By: Marin Olp M.D.   On: 09/08/2020 14:46   ECHOCARDIOGRAM COMPLETE  Result Date: 09/07/2020    ECHOCARDIOGRAM REPORT   Patient Name:   Terry Howell. Date of Exam: 09/07/2020 Medical Rec #:  [id number]          Height: Accession #:    [id number]         Weight:       275.6 lb  Date of Birth:  1956-07-24         BSA:          2.500 m Patient Age:    64 years           BP:           122/62 mmHg Patient Gender:  M                  HR:           120 bpm. Exam Location:  Inpatient Procedure: Intracardiac Opacification Agent and 2D Echo Indications:    Stroke 434.91 / I163.9  History:        Patient has no prior history of Echocardiogram examinations.                 CHF, Stroke; Risk Factors:Hypertension and Diabetes. Acute                 ischemic left MCA stroke , Acute Respiratory Failure.  Sonographer:    Leavy Cella Referring Phys: [id number] [place] R AROOR IMPRESSIONS  1. Left ventricular ejection fraction, by estimation, is 30 to 35%. The left ventricle has moderately decreased function. The left ventricle demonstrates regional wall motion abnormalities (see scoring diagram/findings for description). The left ventricular internal cavity size was mildly dilated. There is moderate left ventricular hypertrophy. Left ventricular diastolic parameters are indeterminate.  2. Right ventricular systolic function is normal. The right ventricular size is normal.  3. The mitral valve is degenerative. Mild mitral valve regurgitation. No evidence of mitral stenosis. Moderate mitral annular calcification.  4. The aortic valve is tricuspid. Aortic valve regurgitation is not visualized. Mild to moderate aortic valve sclerosis/calcification is present, without any evidence of aortic stenosis.  5. The inferior vena cava is dilated in size with >50% respiratory variability, suggesting right atrial pressure of 8 mmHg. Conclusion(s)/Recommendation(s): No intracardiac source of embolism detected on this transthoracic study. A transesophageal echocardiogram is recommended to exclude cardiac source of embolism if clinically indicated. FINDINGS  Left Ventricle: Left ventricular ejection fraction, by estimation, is 30 to 35%. The left ventricle has moderately decreased function. The left ventricle demonstrates regional wall motion abnormalities. The left ventricular internal cavity size was mildly dilated. There is moderate left ventricular  hypertrophy. Left ventricular diastolic parameters are indeterminate.  LV Wall Scoring: The mid and distal anterior septum, entire apex, and mid and distal inferior wall are akinetic. Right Ventricle: The right ventricular size is normal. No increase in right ventricular wall thickness. Right ventricular systolic function is normal. Left Atrium: Left atrial size was normal in size. Right Atrium: Right atrial size was normal in size. Pericardium: There is no evidence of pericardial effusion. Mitral Valve: The mitral valve is degenerative in appearance. Moderate mitral annular calcification. Mild mitral valve regurgitation. No evidence of mitral valve stenosis. Tricuspid Valve: The tricuspid valve is normal in structure. Tricuspid valve regurgitation is not demonstrated. No evidence of tricuspid stenosis. Aortic Valve: The aortic valve is tricuspid. Aortic valve regurgitation is not visualized. Mild to moderate aortic valve sclerosis/calcification is present, without any evidence of aortic stenosis. Pulmonic Valve: The pulmonic valve was normal in structure. Pulmonic valve regurgitation is not visualized. No evidence of pulmonic stenosis. Aorta: The aortic root is normal in size and structure. Venous: The inferior vena cava is dilated in size with greater than 50% respiratory variability, suggesting right atrial pressure of 8 mmHg. IAS/Shunts: No atrial level shunt detected by color flow Doppler.  LEFT VENTRICLE PLAX  2D LVIDd:         5.70 cm  Diastology LVIDs:         4.20 cm  LV e' medial:    15.00 cm/s LV PW:         1.40 cm  LV E/e' medial:  7.5 LV IVS:        1.40 cm  LV e' lateral:   14.10 cm/s LVOT diam:     2.00 cm  LV E/e' lateral: 7.9 LVOT Area:     3.14 cm  RIGHT VENTRICLE RV S prime:     21.90 cm/s TAPSE (M-mode): 2.4 cm LEFT ATRIUM             Index       RIGHT ATRIUM           Index LA diam:        4.00 cm 1.60 cm/m  RA Area:     16.20 cm LA Vol (A2C):   68.8 ml 27.52 ml/m RA Volume:   44.70 ml   17.88 ml/m LA Vol (A4C):   95.9 ml 38.35 ml/m LA Biplane Vol: 85.2 ml 34.07 ml/m   AORTA Ao Root diam: 3.80 cm MITRAL VALVE MV Area (PHT): 5.97 cm     SHUNTS MV Decel Time: 127 msec     Systemic Diam: 2.00 cm MR Peak grad: 47.9 mmHg MR Vmax:      346.00 cm/s MV E velocity: 112.00 cm/s Donato Schultz MD Electronically signed by Donato Schultz MD Signature Date/Time: 09/07/2020/1:40:29 PM    Final    IR PERCUTANEOUS ART THROMBECTOMY/INFUSION INTRACRANIAL INC DIAG ANGIO  Result Date: 09/07/2020 INDICATION: 63-year-ol male presents for mechanical thrombectomy of left emergent large vessel occlusion involving the left M1 EXAM: ULTRASOUND-GUIDED ACCESS RIGHT COMMON FEMORAL ARTERY CERVICAL AND CEREBRAL ANGIOGRAM MECHANICAL THROMBECTOMY LEFT MCA ANGIO-SEAL FOR HEMOSTASIS COMPARISON:  CT imaging same day MEDICATIONS: None ANESTHESIA/SEDATION: The anesthesia team was present to provide general endotracheal tube anesthesia and for patient monitoring during the procedure. Intubation was performed in negative pressure [place] in neuro IR holding. Left radial arterial line was performed by the anesthesia team. Interventional neuro radiology nursing staff was also present. CONTRAST:  60 cc contrast FLUOROSCOPY TIME:  Fluoroscopy Time: 8 minutes 48 seconds (778 mGy). COMPLICATIONS: None TECHNIQUE: Informed written consent was obtained from the patient's family after a thorough discussion of the procedural risks, benefits and alternatives. Specific risks discussed include: Bleeding, infection, contrast reaction, kidney injury/failure, need for further procedure/surgery, arterial injury or dissection, embolization to new territory, intracranial hemorrhage (10-15% risk), neurologic deterioration, cardiopulmonary collapse, death. All questions were addressed. Maximal Sterile Barrier Technique was utilized including during the procedure including caps, mask, sterile gowns, sterile gloves, sterile drape, hand hygiene and skin antiseptic. A  timeout was performed prior to the initiation of the procedure. The anesthesia team was present to provide general endotracheal tube anesthesia and for patient monitoring during the procedure. Interventional neuro radiology nursing staff was also present. FINDINGS: Initial Findings: Left common carotid artery:  Normal course caliber and contour. Left external carotid artery: Patent with antegrade flow. Left internal carotid artery: Normal course caliber and contour of the cervical portion. Vertical and petrous segment patent with normal course caliber contour. Cavernous segment patent. Clinoid segment patent. Antegrade flow of the ophthalmic artery. Ophthalmic segment patent. Terminus patent. Left MCA: No significant atherosclerotic changes are narrowing of the left MCA. There is a fairly early division/origin of the MCA, with an upgoing branch just beyond the lenticulostriate  arteries, superior division. There is a orbital frontal or frontal polar branch in a downgoing configuration. The dominant branch is the inferior division, which is apparently the dominant division given its supply to the parietal territory after reperfusion. Of note, there is an M4 occlusion of the early superior branch, which remains occluded throughout. Left ACA: A 1 segment patent. A 2 segment perfuses the right territory. Significant p.o. collaterals are identified, attempting to reperfuse the parietal territory and the temporal territory from the [place] distribution in the watershed area. Completion Findings: Left MCA: Complete reperfusion of the dominant inferior division of the M1 with no embolization new territory. Flat panel CT: No hemorrhage. There is stagnant focus of contrast associated with the M4 occlusion, within a separate arterial territory than the treated M1 segment. PROCEDURE: The anesthesia team was present to provide general endotracheal tube anesthesia and for patient monitoring during the procedure. Intubation was  performed in negative pressure [place] in neuro IR holding. Interventional neuro radiology nursing staff was also present. Ultrasound survey of the right inguinal region was performed with images stored and sent to PACs. 11 blade scalpel was used to make a small incision. Blunt dissection was performed with US guidance. A micropuncture needle was used access the right common femoral artery under ultrasound. With excellent arterial blood flow returned, an .018 micro wire was passed through the needle, observed to enter the abdominal aorta under fluoroscopy. The needle was removed, and a micropuncture sheath was placed over the wire. The inner dilator and wire were removed, and an 035 wire was advanced under fluoroscopy into the abdominal aorta. The sheath was removed and a 25cm 30F straight vascular sheath was placed. The dilator was removed and the sheath was flushed. Sheath was attached to pressurized and heparinized saline bag for constant forward flow. A coaxial system was then advanced over the 035 wire. This included a 95cm 087 "Walrus" balloon guide with coaxial 125cm Berenstein diagnostic catheter. This was advanced to the proximal descending thoracic aorta. Wire was then removed. Double flush of the catheter was performed. Catheter was then used to select the left common carotid artery. Angiogram was performed. Using roadmap technique, the catheter was advanced over a standard glide wire into left cervical ICA, with distal position achieved of the balloon guide. The diagnostic catheter and the wire were removed. Formal angiogram was performed. Road map function was used once the occluded vessel was identified. Copious back flush was performed and the balloon catheter was attached to heparinized and pressurized saline bag for forward flow. A second coaxial system was then advanced through the balloon catheter, which included the selected intermediate catheter, microcatheter, and microwire. In this scenario, the  set up included a 137cm zoom 71 intermediate catheter, a Trevo Pro18 microcatheter, and 014 synchro soft wire. This system was advanced through the balloon guide catheter under the road-map function, with adequate back-flush at the rotating hemostatic valve at that back end of the balloon guide. Microcatheter and the intermediate catheter system were advanced through the terminal ICA and MCA to the level of the occlusion. The micro wire was then carefully advanced up to the occluded segment. Intermediate catheter was then advanced on the microcatheter and wire combination, up to the proximal face of the thrombus. The microcatheter and microwire were then gently removed and the rotating hemostatic valve was removed from the intermediate catheter. Direct aspiration was then performed through the zoom catheter. Zoom catheter was then gently withdrawn with full aspiration, while manipulating the balloon guide  more proximally within the cervical artery. Zoom catheter was slowly withdrawn from the balloon guide. Angiogram was performed, confirming complete restoration of flow. Angiogram of the cervical ICA was performed. Balloon guide was then removed. The skin at the puncture site was then cleaned with Chlorhexidine. The 8 French sheath was removed and an 51F angioseal was deployed. Flat panel CT was performed. Patient was extubated once the CT was reviewed. Patient tolerated the procedure well and remained hemodynamically stable throughout. No complications were encountered and no significant blood loss encountered. IMPRESSION: Status post ultrasound guided access right common femoral artery for left-sided cervical/cerebral angiogram and mechanical thrombectomy of left distal M1 occlusion using direct aspiration technique and achieving complete reperfusion of the occluded vessel. Angio-Seal for hemostasis. PLAN: The patient will remain intubated, given his comorbidities ICU status Target systolic blood pressure of  120-140 Right hip straight time 6 hours Frequent neurovascular checks Repeat neurologic imaging with CT and/MRI at the discretion of neurology team Electronically Signed   By: Corrie Mckusick D.O.   On: 09/07/2020 10:18     Cardiac Studies   ECHO:  09/07/2020 1. Left ventricular ejection fraction, by estimation, is 30 to 35%. The  left ventricle has moderately decreased function. The left ventricle  demonstrates regional wall motion abnormalities (see scoring  diagram/findings for description). The left  ventricular internal cavity size was mildly dilated. There is moderate  left ventricular hypertrophy. Left ventricular diastolic parameters are  indeterminate.  2. Right ventricular systolic function is normal. The right ventricular  size is normal.  3. The mitral valve is degenerative. Mild mitral valve regurgitation. No  evidence of mitral stenosis. Moderate mitral annular calcification.  4. The aortic valve is tricuspid. Aortic valve regurgitation is not  visualized. Mild to moderate aortic valve sclerosis/calcification is  present, without any evidence of aortic stenosis.  5. The inferior vena cava is dilated in size with >50% respiratory  variability, suggesting right atrial pressure of 8 mmHg.   Conclusion(s)/Recommendation(s): No intracardiac source of embolism  detected on this transthoracic study. A transesophageal echocardiogram is  recommended to exclude cardiac source of embolism if clinically indicated.   Patient Profile     64 y.o. 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 Acute systolic CHF - EF now 07-62% - hx CAD but w/ no recent Cards care - per wife, EF 40% after MI 2004 - I/O net -1.9 L so far - SBP has limited diuresis, range 99-121 last 24 hours - got Lasix 40 mg IV x 1 yesterday, none  ordered for today - BUN 31->39, Cr 1.11>1.14 after Lasix - edema on CXR yesterday, but volume by exam difficult to assess -  recheck CXR - once volume status stable start BB, then other meds for CHF as BP will allow - w/ recent CVA, no ischemic eval at this time, consider if EF does not improve w/ Rx - ask if they prefer f/u in [place], saw Dr Bettina Gavia in 2004  2. Decreased pedal pulses - able to doppler, very difficult to palpate - ck ABIs if distal pulses still weak once general medical condition improves  Otherwise, per Dr Erlinda Hong Active Problems:   Acute ischemic left MCA stroke ([place])    Signed, Rosaria Ferries , PA-C 8:43 AM 09/09/2020 Pager: [phone]

## 2020-09-09 NOTE — Evaluation (Signed)
Physical Therapy Evaluation Patient Details Name: Terry Howell. MRN: [id number] DOB: 02-08-1956 Today's Date: 09/09/2020   History of Present Illness  Terry Howell. is a 64 y.o. male with past medical history significant for hypertension, hyperlipidemia, uncontrolled insulin-dependent diabetes mellitus, history of coronary artery disease status post PCI in 2004, functioning at 40%,, heart failure presents to the emergency department [facility] with sudden onset aphasia and right-sided weakness on 9/17. Transferred to [facility] for mechanical thrombectomy of L M1 occlusion, intubated 9/17.    Clinical Impression   Pt admitted with above. Pt maintained eyes closed majority of time. Pt following commands about 50% of time with L UE/LE, withdrawing to noxious stimuli x 4 extremities but only voluntary/purposeful movement with L UE. Pt with noted R hemiplegia, unsure of degree of aphasia reported in chart as pt is intubated at this time. Pt with R gaze preference. Pt requiring totalAx2 for mobility at this time. Recommending CIR upon d/c for maximal functional recovery as pt was indep PTA.    Follow Up Recommendations CIR    Equipment Recommendations   (TBD)    Recommendations for Other Services Rehab consult     Precautions / Restrictions Precautions Precautions: Fall Precaution Comments: intubated, in process of weaning Restrictions Weight Bearing Restrictions: No      Mobility  Bed Mobility Overal bed mobility: Needs Assistance Bed Mobility: Supine to Sit;Sit to Supine     Supine to sit: Total assist;+2 for physical assistance (RN to manage vent) Sit to supine: Total assist;+2 for physical assistance (RN to manage vent)   General bed mobility comments: pt with no initiation, pt with intermittent eyes opening but unable to sustain  Transfers                 General transfer comment: not appropriate at this time  Ambulation/Gait             General  Gait Details: unable  Stairs            Wheelchair Mobility    Modified Rankin (Stroke Patients Only) Modified Rankin (Stroke Patients Only) Pre-Morbid Rankin Score: Slight disability Modified Rankin: Moderately severe disability     Balance Overall balance assessment: Needs assistance Sitting-balance support: Single extremity supported Sitting balance-Leahy Scale: Poor Sitting balance - Comments: pt supporting self with L UE, mild pushing to the R, R lateral lean, maxA posteriorly to maintain EOB balance Postural control: Right lateral lean                                   Pertinent Vitals/Pain Pain Assessment: Faces Faces Pain Scale: Hurts little more Pain Location: grimacing with noxious stimuli x 4 extremities, + withdrawl x 4 extremities    Home Living Family/patient expects to be discharged to:: Private residence Living Arrangements: Spouse/significant other               Additional Comments: pt intubated, unable to provide report of PLOF    Prior Function Level of Independence: Independent         Comments: was taking trash out when this incident occured     Hand Dominance   Dominant Hand: Right    Extremity/Trunk Assessment   Upper Extremity Assessment Upper Extremity Assessment: Defer to OT evaluation (not voluntary and purposeful mvmt of L UE)    Lower Extremity Assessment Lower Extremity Assessment: RLE deficits/detail;LLE deficits/detail RLE Deficits / Details: pt  with active withdraw of R LE to noxious stimuli, no active movement to command RLE Sensation: decreased light touch LLE Deficits / Details: generalized weakness, pt actively movement L LE in sitting EOB but through less than 1/2 of ROM into extension    Cervical / Trunk Assessment Cervical / Trunk Assessment:  (large body habitus)  Communication   Communication: Expressive difficulties (reported aphasia, currently intubated)  Cognition Arousal/Alertness:  Lethargic Behavior During Therapy: Flat affect Overall Cognitive Status: Difficult to assess                                 General Comments: pt with delayed processing, following simple commands 50% of time with L UE and LE.       General Comments General comments (skin integrity, edema, etc.): VSS, pt weaning off vent, large body habitus    Exercises     Assessment/Plan    PT Assessment Patient needs continued PT services  PT Problem List Decreased strength;Decreased range of motion;Decreased activity tolerance;Decreased mobility;Decreased balance;Decreased coordination;Decreased cognition;Decreased safety awareness;Decreased knowledge of use of DME;Obesity       PT Treatment Interventions DME instruction;Gait training;Stair training;Functional mobility training;Therapeutic activities;Balance training;Therapeutic exercise;Neuromuscular re-education;Cognitive remediation;Patient/family education    PT Goals (Current goals can be found in the Care Plan section)  Acute Rehab PT Goals Patient Stated Goal: unable to state PT Goal Formulation: Patient unable to participate in goal setting Time For Goal Achievement: 09/23/20 Potential to Achieve Goals: Good    Frequency Min 4X/week   Barriers to discharge Other (comment) unsure how much wife can physical assist    Co-evaluation PT/OT/SLP Co-Evaluation/Treatment: Yes Reason for Co-Treatment: Complexity of the patient's impairments (multi-system involvement) PT goals addressed during session: Mobility/safety with mobility         AM-PAC PT "6 Clicks" Mobility  Outcome Measure Help needed turning from your back to your side while in a flat bed without using bedrails?: Total Help needed moving from lying on your back to sitting on the side of a flat bed without using bedrails?: Total Help needed moving to and from a bed to a chair (including a wheelchair)?: Total Help needed standing up from a chair using your  arms (e.g., wheelchair or bedside chair)?: Total Help needed to walk in hospital room?: Total Help needed climbing 3-5 steps with a railing? : Total 6 Click Score: 6    End of Session Equipment Utilized During Treatment:  (vent) Activity Tolerance: Patient tolerated treatment well Patient left: in bed;with call bell/phone within reach;with nursing/sitter in room (in chair position) Nurse Communication: Mobility status (RN present) PT Visit Diagnosis: Unsteadiness on feet (R26.81);Muscle weakness (generalized) (M62.81);Difficulty in walking, not elsewhere classified (R26.2);Hemiplegia and hemiparesis Hemiplegia - Right/Left: Right Hemiplegia - dominant/non-dominant: Dominant Hemiplegia - caused by: Cerebral infarction    Time: 4097-3532 PT Time Calculation (min) (ACUTE ONLY): 23 min   Charges:   PT Evaluation $PT Eval Moderate Complexity: 1 Mod          Lewis Shock, PT, DPT Acute Rehabilitation Services Pager #: [phone] Office #: [phone]   Iona Hansen 09/09/2020, 10:05 AM

## 2020-09-10 ENCOUNTER — Encounter (HOSPITAL_COMMUNITY): Payer: Self-pay | Admitting: Neurology

## 2020-09-10 ENCOUNTER — Inpatient Hospital Stay (HOSPITAL_COMMUNITY): Payer: Medicare Other

## 2020-09-10 DIAGNOSIS — R7881 Bacteremia: Secondary | ICD-10-CM

## 2020-09-10 DIAGNOSIS — B999 Unspecified infectious disease: Secondary | ICD-10-CM

## 2020-09-10 DIAGNOSIS — I5021 Acute systolic (congestive) heart failure: Secondary | ICD-10-CM

## 2020-09-10 HISTORY — DX: Acute systolic (congestive) heart failure: I50.21

## 2020-09-10 LAB — BLOOD CULTURE ID PANEL (REFLEXED) - BCID2

## 2020-09-10 LAB — GLUCOSE, CAPILLARY
Glucose-Capillary: 148 mg/dL — ABNORMAL HIGH (ref 70–99)
Glucose-Capillary: 151 mg/dL — ABNORMAL HIGH (ref 70–99)
Glucose-Capillary: 159 mg/dL — ABNORMAL HIGH (ref 70–99)
Glucose-Capillary: 165 mg/dL — ABNORMAL HIGH (ref 70–99)
Glucose-Capillary: 180 mg/dL — ABNORMAL HIGH (ref 70–99)
Glucose-Capillary: 180 mg/dL — ABNORMAL HIGH (ref 70–99)
Glucose-Capillary: 180 mg/dL — ABNORMAL HIGH (ref 70–99)
Glucose-Capillary: 183 mg/dL — ABNORMAL HIGH (ref 70–99)
Glucose-Capillary: 191 mg/dL — ABNORMAL HIGH (ref 70–99)
Glucose-Capillary: 246 mg/dL — ABNORMAL HIGH (ref 70–99)
Glucose-Capillary: 309 mg/dL — ABNORMAL HIGH (ref 70–99)
Glucose-Capillary: 401 mg/dL — ABNORMAL HIGH (ref 70–99)
Glucose-Capillary: 420 mg/dL — ABNORMAL HIGH (ref 70–99)

## 2020-09-10 LAB — BASIC METABOLIC PANEL
Anion gap: 13 (ref 5–15)
BUN: 45 mg/dL — ABNORMAL HIGH (ref 8–23)
CO2: 25 mmol/L (ref 22–32)
Calcium: 8.3 mg/dL — ABNORMAL LOW (ref 8.9–10.3)
Chloride: 110 mmol/L (ref 98–111)
Creatinine, Ser: 0.98 mg/dL (ref 0.61–1.24)
GFR calc Af Amer: >60 mL/min (ref >60)
GFR calc non Af Amer: >60 mL/min (ref >60)
Glucose, Bld: 195 mg/dL — ABNORMAL HIGH (ref 70–99)
Potassium: 3.4 mmol/L — ABNORMAL LOW (ref 3.5–5.1)
Sodium: 148 mmol/L — ABNORMAL HIGH (ref 135–145)

## 2020-09-10 LAB — CBC
HCT: 33.2 % — ABNORMAL LOW (ref 39.0–52.0)
Hemoglobin: 10.3 g/dL — ABNORMAL LOW (ref 13.0–17.0)
MCH: 31.9 pg (ref 26.0–34.0)
MCHC: 31 g/dL (ref 30.0–36.0)
MCV: 102.8 fL — ABNORMAL HIGH (ref 80.0–100.0)
Platelets: 185 10*3/uL (ref 150–400)
RBC: 3.23 MIL/uL — ABNORMAL LOW (ref 4.22–5.81)
RDW: 14.5 % (ref 11.5–15.5)
WBC: 18.7 10*3/uL — ABNORMAL HIGH (ref 4.0–10.5)
nRBC: 0 % (ref 0.0–0.2)

## 2020-09-10 MED ORDER — POTASSIUM CHLORIDE 20 MEQ/15ML (10%) PO SOLN
40.0000 meq | ORAL | Status: AC
  Administered 2020-09-10: 40 meq

## 2020-09-10 MED ORDER — AMIODARONE LOAD VIA INFUSION
150.0000 mg | Freq: Once | INTRAVENOUS | Status: AC
  Administered 2020-09-10: 150 mg via INTRAVENOUS
  Filled 2020-09-10: qty 83.34

## 2020-09-10 MED ORDER — POTASSIUM CHLORIDE 10 MEQ/100ML IV SOLN
10.0000 meq | INTRAVENOUS | Status: AC
  Administered 2020-09-10 (×4): 10 meq via INTRAVENOUS
  Filled 2020-09-10 (×4): qty 100

## 2020-09-10 MED ORDER — INSULIN ASPART 100 UNIT/ML ~~LOC~~ SOLN
0.0000 [IU] | SUBCUTANEOUS | Status: DC
  Administered 2020-09-10: 20 [IU] via SUBCUTANEOUS
  Administered 2020-09-10: 15 [IU] via SUBCUTANEOUS

## 2020-09-10 MED ORDER — ORAL CARE MOUTH RINSE: 15.0000 mL | Freq: Two times a day (BID) | OROMUCOSAL | Status: DC

## 2020-09-10 MED ORDER — INSULIN GLARGINE 100 UNIT/ML ~~LOC~~ SOLN
40.0000 [IU] | Freq: Two times a day (BID) | SUBCUTANEOUS | Status: DC
  Administered 2020-09-10: 40 [IU] via SUBCUTANEOUS
  Filled 2020-09-10 (×4): qty 0.4

## 2020-09-10 MED ORDER — METOPROLOL TARTRATE 25 MG PO TABS: 12.5000 mg | ORAL_TABLET | Freq: Two times a day (BID) | ORAL | Status: DC

## 2020-09-10 MED ORDER — POTASSIUM CHLORIDE 20 MEQ/15ML (10%) PO SOLN
40.0000 meq | ORAL | Status: DC
  Filled 2020-09-10: qty 30

## 2020-09-10 MED ORDER — AMIODARONE HCL IN DEXTROSE 360-4.14 MG/200ML-% IV SOLN
30.0000 mg/h | INTRAVENOUS | Status: DC
  Administered 2020-09-10 – 2020-09-11 (×2): 30 mg/h via INTRAVENOUS
  Filled 2020-09-10: qty 200

## 2020-09-10 MED ORDER — FUROSEMIDE 10 MG/ML IJ SOLN: 40.0000 mg | Freq: Once | INTRAMUSCULAR | Status: DC

## 2020-09-10 MED ORDER — FUROSEMIDE 10 MG/ML IJ SOLN
40.0000 mg | Freq: Two times a day (BID) | INTRAMUSCULAR | Status: DC
  Administered 2020-09-10 – 2020-09-11 (×3): 40 mg via INTRAVENOUS
  Filled 2020-09-10 (×3): qty 4

## 2020-09-10 MED ORDER — CARVEDILOL 3.125 MG PO TABS: 3.1250 mg | ORAL_TABLET | Freq: Two times a day (BID) | ORAL | Status: DC

## 2020-09-10 MED ORDER — POTASSIUM CHLORIDE 20 MEQ/15ML (10%) PO SOLN: 40.0000 meq | ORAL | Status: DC

## 2020-09-10 MED ORDER — AMIODARONE HCL IN DEXTROSE 360-4.14 MG/200ML-% IV SOLN
60.0000 mg/h | INTRAVENOUS | Status: AC
  Administered 2020-09-10 (×2): 60 mg/h via INTRAVENOUS
  Filled 2020-09-10: qty 400

## 2020-09-10 MED ORDER — METOPROLOL TARTRATE 5 MG/5ML IV SOLN
INTRAVENOUS | Status: AC
  Administered 2020-09-10: 2.5 mg via INTRAVENOUS
  Filled 2020-09-10: qty 5

## 2020-09-10 MED ORDER — METOPROLOL TARTRATE 5 MG/5ML IV SOLN: 5.0000 mg | Freq: Once | INTRAVENOUS | Status: AC

## 2020-09-10 MED ORDER — CHLORHEXIDINE GLUCONATE 0.12 % MT SOLN
15.0000 mL | Freq: Two times a day (BID) | OROMUCOSAL | Status: DC
  Administered 2020-09-10: 15 mL via OROMUCOSAL

## 2020-09-10 MED ORDER — SODIUM CHLORIDE 0.9 % IV SOLN
2.0000 g | Freq: Three times a day (TID) | INTRAVENOUS | Status: DC
  Administered 2020-09-10 – 2020-09-11 (×3): 2 g via INTRAVENOUS
  Filled 2020-09-10 (×3): qty 2

## 2020-09-10 MED ORDER — CARVEDILOL 3.125 MG PO TABS
6.2500 mg | ORAL_TABLET | Freq: Two times a day (BID) | ORAL | Status: DC
  Filled 2020-09-10: qty 2
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
## 2020-09-10 NOTE — Progress Notes (Signed)
STROKE TEAM PROGRESS NOTE   INTERVAL HISTORY RN at bedside.  Patient neurological stable, no acute event overnight.  Respiratory improved, on pressure support at 5 with PEEP at 5, plan to extubae today  OBJECTIVE Vitals:   09/10/20 0700 09/10/20 0800 09/10/20 0900 09/10/20 0934  BP: 117/66 114/66 113/66 113/66  Pulse: 86 90 89   Resp: 19 (!) 22 17   Temp:  99.3 F (37.4 C)    TempSrc:  Axillary    SpO2: 97% 98% 97%   Weight:       CBC:  Recent Labs  Lab 09/09/20 0357 09/10/20 0658  WBC 17.6* 18.7*  HGB 10.3* 10.3*  HCT 32.9* 33.2*  MCV 102.8* 102.8*  PLT 186 185   Basic Metabolic Panel:  Recent Labs  Lab 09/09/20 0357 09/09/20 0357 09/09/20 1710 09/10/20 0658  NA 142   < > 145 148*  K 3.8   < > 3.5 3.4*  CL 109   < > 109 110  CO2 22   < > 23 25  GLUCOSE 378*   < > 438* 195*  BUN 39*   < > 42* 45*  CREATININE 1.14   < > 1.12 0.98  CALCIUM 8.2*   < > 8.4* 8.3*  MG 2.0  --  1.9  --   PHOS 2.7  --  2.6  --    < > = values in this interval not displayed.   Lipid Panel:     Component Value Date/Time   CHOL 97 09/07/2020 0935   TRIG 86 09/07/2020 0935   HDL 30 (L) 09/07/2020 0935   CHOLHDL 3.2 09/07/2020 0935   VLDL 17 09/07/2020 0935   LDLCALC 50 09/07/2020 0935   HgbA1c:  Lab Results  Component Value Date   HGBA1C 8.7 (H) 09/07/2020   Urine Drug Screen:     Component Value Date/Time   LABOPIA NONE DETECTED 09/07/2020 0935   COCAINSCRNUR NONE DETECTED 09/07/2020 0935   LABBENZ NONE DETECTED 09/07/2020 0935   AMPHETMU NONE DETECTED 09/07/2020 0935   THCU NONE DETECTED 09/07/2020 0935   LABBARB NONE DETECTED 09/07/2020 0935    Alcohol Level No results found for: ETH  IMAGING  MR ANGIO HEAD WO CONTRAST  Result Date: 09/07/2020 CLINICAL DATA:  64 year old male code stroke presentation yesterday with left MCA ELVO at [facility]. Status post IV tPA and endovascular revascularization. EXAM: MRI HEAD WITHOUT CONTRAST MRA HEAD WITHOUT CONTRAST  TECHNIQUE: Multiplanar, multiecho pulse sequences of the brain and surrounding structures were obtained without intravenous contrast. Angiographic images of the head were obtained using MRA technique without contrast. COMPARISON:  [facility] CT head and CTA head and neck yesterday. FINDINGS: MRI HEAD FINDINGS Brain: Patchy restricted diffusion and petechial hemorrhage in the left basal ganglia which are mildly expanded (series 6, image 85, series 14, image 32). Heidelberg Classification type 1c vs. type 2 petechial blood. Mild additional mostly punctate but occasional patchy scattered restricted diffusion in the superior left temporal lobe, posterior left insula. No additional blood products. No contralateral or posterior fossa restricted diffusion. No midline shift, ventriculomegaly, extra-axial collection. Outside of the acute findings the gray and white matter signal is largely normal for age. Cervicomedullary junction and pituitary are within normal limits. Vascular: Major intracranial vascular flow voids are preserved. Skull and upper cervical spine: Negative for age visible cervical spine. Visualized bone marrow signal is within normal limits. Sinuses/Orbits: Postoperative changes to both globes. Minor ethmoid and sphenoid sinus disease. Other: Intubated. Small volume  of fluid layering in the pharynx. Mastoids are clear. Visible internal auditory structures appear normal. MRA HEAD FINDINGS Antegrade flow in the posterior circulation with mildly dominant left vertebral artery. No distal vertebral or basilar stenosis. Patent PICA origins, AICA, SCA and PCA origins. Posterior communicating arteries are diminutive or absent. Bilateral PCA branches are within normal limits. Antegrade flow in both ICA siphons. No siphon stenosis. Normal ophthalmic artery origins. Patent carotid termini. Patent MCA and ACA origins. Anterior communicating artery, visible ACA branches, right MCA M1 and visible right MCA branches  are within normal limits. Patent left MCA M1 and bifurcation now. No significant vessel irregularity. Visible left MCA branches are within normal limits. IMPRESSION: 1. Left lenticulostriate territory infarcts with patchy petechial hemorrhage and mildly expanded left basal ganglia. No significant intracranial mass effect. Elsewhere there are a few scattered punctate and occasionally patchy infarcts in the Left MCA territory. 2. Normal Left MCA and negative intracranial MRA. 3. Outside of the left MCA territory negative for age MRI appearance of the brain. Electronically Signed   By: Odessa Fleming M.D.   On: 09/07/2020 19:22   MR BRAIN WO CONTRAST  Result Date: 09/07/2020 CLINICAL DATA:  64 year old male code stroke presentation yesterday with left MCA ELVO at [facility]. Status post IV tPA and endovascular revascularization. EXAM: MRI HEAD WITHOUT CONTRAST MRA HEAD WITHOUT CONTRAST TECHNIQUE: Multiplanar, multiecho pulse sequences of the brain and surrounding structures were obtained without intravenous contrast. Angiographic images of the head were obtained using MRA technique without contrast. COMPARISON:  [facility] CT head and CTA head and neck yesterday. FINDINGS: MRI HEAD FINDINGS Brain: Patchy restricted diffusion and petechial hemorrhage in the left basal ganglia which are mildly expanded (series 6, image 85, series 14, image 32). Heidelberg Classification type 1c vs. type 2 petechial blood. Mild additional mostly punctate but occasional patchy scattered restricted diffusion in the superior left temporal lobe, posterior left insula. No additional blood products. No contralateral or posterior fossa restricted diffusion. No midline shift, ventriculomegaly, extra-axial collection. Outside of the acute findings the gray and white matter signal is largely normal for age. Cervicomedullary junction and pituitary are within normal limits. Vascular: Major intracranial vascular flow voids are preserved.  Skull and upper cervical spine: Negative for age visible cervical spine. Visualized bone marrow signal is within normal limits. Sinuses/Orbits: Postoperative changes to both globes. Minor ethmoid and sphenoid sinus disease. Other: Intubated. Small volume of fluid layering in the pharynx. Mastoids are clear. Visible internal auditory structures appear normal. MRA HEAD FINDINGS Antegrade flow in the posterior circulation with mildly dominant left vertebral artery. No distal vertebral or basilar stenosis. Patent PICA origins, AICA, SCA and PCA origins. Posterior communicating arteries are diminutive or absent. Bilateral PCA branches are within normal limits. Antegrade flow in both ICA siphons. No siphon stenosis. Normal ophthalmic artery origins. Patent carotid termini. Patent MCA and ACA origins. Anterior communicating artery, visible ACA branches, right MCA M1 and visible right MCA branches are within normal limits. Patent left MCA M1 and bifurcation now. No significant vessel irregularity. Visible left MCA branches are within normal limits. IMPRESSION: 1. Left lenticulostriate territory infarcts with patchy petechial hemorrhage and mildly expanded left basal ganglia. No significant intracranial mass effect. Elsewhere there are a few scattered punctate and occasionally patchy infarcts in the Left MCA territory. 2. Normal Left MCA and negative intracranial MRA. 3. Outside of the left MCA territory negative for age MRI appearance of the brain. Electronically Signed   By: Althea Grimmer.D.  On: 09/07/2020 19:22   IR CT Head Ltd  Result Date: 09/07/2020 INDICATION: 63-year-ol male presents for mechanical thrombectomy of left emergent large vessel occlusion involving the left M1 EXAM: ULTRASOUND-GUIDED ACCESS RIGHT COMMON FEMORAL ARTERY CERVICAL AND CEREBRAL ANGIOGRAM MECHANICAL THROMBECTOMY LEFT MCA ANGIO-SEAL FOR HEMOSTASIS COMPARISON:  CT imaging same day MEDICATIONS: None ANESTHESIA/SEDATION: The anesthesia team was  present to provide general endotracheal tube anesthesia and for patient monitoring during the procedure. Intubation was performed in negative pressure Bay in neuro IR holding. Left radial arterial line was performed by the anesthesia team. Interventional neuro radiology nursing staff was also present. CONTRAST:  60 cc contrast FLUOROSCOPY TIME:  Fluoroscopy Time: 8 minutes 48 seconds (778 mGy). COMPLICATIONS: None TECHNIQUE: Informed written consent was obtained from the patient's family after a thorough discussion of the procedural risks, benefits and alternatives. Specific risks discussed include: Bleeding, infection, contrast reaction, kidney injury/failure, need for further procedure/surgery, arterial injury or dissection, embolization to new territory, intracranial hemorrhage (10-15% risk), neurologic deterioration, cardiopulmonary collapse, death. All questions were addressed. Maximal Sterile Barrier Technique was utilized including during the procedure including caps, mask, sterile gowns, sterile gloves, sterile drape, hand hygiene and skin antiseptic. A timeout was performed prior to the initiation of the procedure. The anesthesia team was present to provide general endotracheal tube anesthesia and for patient monitoring during the procedure. Interventional neuro radiology nursing staff was also present. FINDINGS: Initial Findings: Left common carotid artery:  Normal course caliber and contour. Left external carotid artery: Patent with antegrade flow. Left internal carotid artery: Normal course caliber and contour of the cervical portion. Vertical and petrous segment patent with normal course caliber contour. Cavernous segment patent. Clinoid segment patent. Antegrade flow of the ophthalmic artery. Ophthalmic segment patent. Terminus patent. Left MCA: No significant atherosclerotic changes are narrowing of the left MCA. There is a fairly early division/origin of the MCA, with an upgoing branch just beyond  the lenticulostriate arteries, superior division. There is a orbital frontal or frontal polar branch in a downgoing configuration. The dominant branch is the inferior division, which is apparently the dominant division given its supply to the parietal territory after reperfusion. Of note, there is an M4 occlusion of the early superior branch, which remains occluded throughout. Left ACA: A 1 segment patent. A 2 segment perfuses the right territory. Significant p.o. collaterals are identified, attempting to reperfuse the parietal territory and the temporal territory from the ACA distribution in the watershed area. Completion Findings: Left MCA: Complete reperfusion of the dominant inferior division of the M1 with no embolization new territory. Flat panel CT: No hemorrhage. There is stagnant focus of contrast associated with the M4 occlusion, within a separate arterial territory than the treated M1 segment. PROCEDURE: The anesthesia team was present to provide general endotracheal tube anesthesia and for patient monitoring during the procedure. Intubation was performed in negative pressure Bay in neuro IR holding. Interventional neuro radiology nursing staff was also present. Ultrasound survey of the right inguinal region was performed with images stored and sent to PACs. 11 blade scalpel was used to make a small incision. Blunt dissection was performed with US guidance. A micropuncture needle was used access the right common femoral artery under ultrasound. With excellent arterial blood flow returned, an .018 micro wire was passed through the needle, observed to enter the abdominal aorta under fluoroscopy. The needle was removed, and a micropuncture sheath was placed over the wire. The inner dilator and wire were removed, and an 035 wire was advanced under  fluoroscopy into the abdominal aorta. The sheath was removed and a 25cm 10F straight vascular sheath was placed. The dilator was removed and the sheath was flushed.  Sheath was attached to pressurized and heparinized saline bag for constant forward flow. A coaxial system was then advanced over the 035 wire. This included a 95cm 087 "Walrus" balloon guide with coaxial 125cm Berenstein diagnostic catheter. This was advanced to the proximal descending thoracic aorta. Wire was then removed. Double flush of the catheter was performed. Catheter was then used to select the left common carotid artery. Angiogram was performed. Using roadmap technique, the catheter was advanced over a standard glide wire into left cervical ICA, with distal position achieved of the balloon guide. The diagnostic catheter and the wire were removed. Formal angiogram was performed. Road map function was used once the occluded vessel was identified. Copious back flush was performed and the balloon catheter was attached to heparinized and pressurized saline bag for forward flow. A second coaxial system was then advanced through the balloon catheter, which included the selected intermediate catheter, microcatheter, and microwire. In this scenario, the set up included a 137cm zoom 71 intermediate catheter, a Trevo Pro18 microcatheter, and 014 synchro soft wire. This system was advanced through the balloon guide catheter under the road-map function, with adequate back-flush at the rotating hemostatic valve at that back end of the balloon guide. Microcatheter and the intermediate catheter system were advanced through the terminal ICA and MCA to the level of the occlusion. The micro wire was then carefully advanced up to the occluded segment. Intermediate catheter was then advanced on the microcatheter and wire combination, up to the proximal face of the thrombus. The microcatheter and microwire were then gently removed and the rotating hemostatic valve was removed from the intermediate catheter. Direct aspiration was then performed through the zoom catheter. Zoom catheter was then gently withdrawn with full  aspiration, while manipulating the balloon guide more proximally within the cervical artery. Zoom catheter was slowly withdrawn from the balloon guide. Angiogram was performed, confirming complete restoration of flow. Angiogram of the cervical ICA was performed. Balloon guide was then removed. The skin at the puncture site was then cleaned with Chlorhexidine. The 8 French sheath was removed and an 10F angioseal was deployed. Flat panel CT was performed. Patient was extubated once the CT was reviewed. Patient tolerated the procedure well and remained hemodynamically stable throughout. No complications were encountered and no significant blood loss encountered. IMPRESSION: Status post ultrasound guided access right common femoral artery for left-sided cervical/cerebral angiogram and mechanical thrombectomy of left distal M1 occlusion using direct aspiration technique and achieving complete reperfusion of the occluded vessel. Angio-Seal for hemostasis. PLAN: The patient will remain intubated, given his comorbidities ICU status Target systolic blood pressure of 120-140 Right hip straight time 6 hours Frequent neurovascular checks Repeat neurologic imaging with CT and/MRI at the discretion of neurology team Electronically Signed   By: Gilmer Mor D.O.   On: 09/07/2020 10:18   IR US Guide Vasc Access Right  Result Date: 09/07/2020 INDICATION: 63-year-ol male presents for mechanical thrombectomy of left emergent large vessel occlusion involving the left M1 EXAM: ULTRASOUND-GUIDED ACCESS RIGHT COMMON FEMORAL ARTERY CERVICAL AND CEREBRAL ANGIOGRAM MECHANICAL THROMBECTOMY LEFT MCA ANGIO-SEAL FOR HEMOSTASIS COMPARISON:  CT imaging same day MEDICATIONS: None ANESTHESIA/SEDATION: The anesthesia team was present to provide general endotracheal tube anesthesia and for patient monitoring during the procedure. Intubation was performed in negative pressure Bay in neuro IR holding. Left radial arterial line  was performed by the  anesthesia team. Interventional neuro radiology nursing staff was also present. CONTRAST:  60 cc contrast FLUOROSCOPY TIME:  Fluoroscopy Time: 8 minutes 48 seconds (778 mGy). COMPLICATIONS: None TECHNIQUE: Informed written consent was obtained from the patient's family after a thorough discussion of the procedural risks, benefits and alternatives. Specific risks discussed include: Bleeding, infection, contrast reaction, kidney injury/failure, need for further procedure/surgery, arterial injury or dissection, embolization to new territory, intracranial hemorrhage (10-15% risk), neurologic deterioration, cardiopulmonary collapse, death. All questions were addressed. Maximal Sterile Barrier Technique was utilized including during the procedure including caps, mask, sterile gowns, sterile gloves, sterile drape, hand hygiene and skin antiseptic. A timeout was performed prior to the initiation of the procedure. The anesthesia team was present to provide general endotracheal tube anesthesia and for patient monitoring during the procedure. Interventional neuro radiology nursing staff was also present. FINDINGS: Initial Findings: Left common carotid artery:  Normal course caliber and contour. Left external carotid artery: Patent with antegrade flow. Left internal carotid artery: Normal course caliber and contour of the cervical portion. Vertical and petrous segment patent with normal course caliber contour. Cavernous segment patent. Clinoid segment patent. Antegrade flow of the ophthalmic artery. Ophthalmic segment patent. Terminus patent. Left MCA: No significant atherosclerotic changes are narrowing of the left MCA. There is a fairly early division/origin of the MCA, with an upgoing branch just beyond the lenticulostriate arteries, superior division. There is a orbital frontal or frontal polar branch in a downgoing configuration. The dominant branch is the inferior division, which is apparently the dominant division given  its supply to the parietal territory after reperfusion. Of note, there is an M4 occlusion of the early superior branch, which remains occluded throughout. Left ACA: A 1 segment patent. A 2 segment perfuses the right territory. Significant p.o. collaterals are identified, attempting to reperfuse the parietal territory and the temporal territory from the ACA distribution in the watershed area. Completion Findings: Left MCA: Complete reperfusion of the dominant inferior division of the M1 with no embolization new territory. Flat panel CT: No hemorrhage. There is stagnant focus of contrast associated with the M4 occlusion, within a separate arterial territory than the treated M1 segment. PROCEDURE: The anesthesia team was present to provide general endotracheal tube anesthesia and for patient monitoring during the procedure. Intubation was performed in negative pressure Bay in neuro IR holding. Interventional neuro radiology nursing staff was also present. Ultrasound survey of the right inguinal region was performed with images stored and sent to PACs. 11 blade scalpel was used to make a small incision. Blunt dissection was performed with US guidance. A micropuncture needle was used access the right common femoral artery under ultrasound. With excellent arterial blood flow returned, an .018 micro wire was passed through the needle, observed to enter the abdominal aorta under fluoroscopy. The needle was removed, and a micropuncture sheath was placed over the wire. The inner dilator and wire were removed, and an 035 wire was advanced under fluoroscopy into the abdominal aorta. The sheath was removed and a 25cm 34F straight vascular sheath was placed. The dilator was removed and the sheath was flushed. Sheath was attached to pressurized and heparinized saline bag for constant forward flow. A coaxial system was then advanced over the 035 wire. This included a 95cm 087 "Walrus" balloon guide with coaxial 125cm Berenstein  diagnostic catheter. This was advanced to the proximal descending thoracic aorta. Wire was then removed. Double flush of the catheter was performed. Catheter was then used to  select the left common carotid artery. Angiogram was performed. Using roadmap technique, the catheter was advanced over a standard glide wire into left cervical ICA, with distal position achieved of the balloon guide. The diagnostic catheter and the wire were removed. Formal angiogram was performed. Road map function was used once the occluded vessel was identified. Copious back flush was performed and the balloon catheter was attached to heparinized and pressurized saline bag for forward flow. A second coaxial system was then advanced through the balloon catheter, which included the selected intermediate catheter, microcatheter, and microwire. In this scenario, the set up included a 137cm zoom 71 intermediate catheter, a Trevo Pro18 microcatheter, and 014 synchro soft wire. This system was advanced through the balloon guide catheter under the road-map function, with adequate back-flush at the rotating hemostatic valve at that back end of the balloon guide. Microcatheter and the intermediate catheter system were advanced through the terminal ICA and MCA to the level of the occlusion. The micro wire was then carefully advanced up to the occluded segment. Intermediate catheter was then advanced on the microcatheter and wire combination, up to the proximal face of the thrombus. The microcatheter and microwire were then gently removed and the rotating hemostatic valve was removed from the intermediate catheter. Direct aspiration was then performed through the zoom catheter. Zoom catheter was then gently withdrawn with full aspiration, while manipulating the balloon guide more proximally within the cervical artery. Zoom catheter was slowly withdrawn from the balloon guide. Angiogram was performed, confirming complete restoration of flow. Angiogram  of the cervical ICA was performed. Balloon guide was then removed. The skin at the puncture site was then cleaned with Chlorhexidine. The 8 French sheath was removed and an 52F angioseal was deployed. Flat panel CT was performed. Patient was extubated once the CT was reviewed. Patient tolerated the procedure well and remained hemodynamically stable throughout. No complications were encountered and no significant blood loss encountered. IMPRESSION: Status post ultrasound guided access right common femoral artery for left-sided cervical/cerebral angiogram and mechanical thrombectomy of left distal M1 occlusion using direct aspiration technique and achieving complete reperfusion of the occluded vessel. Angio-Seal for hemostasis. PLAN: The patient will remain intubated, given his comorbidities ICU status Target systolic blood pressure of 120-140 Right hip straight time 6 hours Frequent neurovascular checks Repeat neurologic imaging with CT and/MRI at the discretion of neurology team Electronically Signed   By: Gilmer Mor D.O.   On: 09/07/2020 10:18   DG CHEST PORT 1 VIEW  Result Date: 09/09/2020 CLINICAL DATA:  Tachycardia EXAM: PORTABLE CHEST 1 VIEW COMPARISON:  09/08/2020 FINDINGS: Endotracheal tube and NG tube remain in place, unchanged. Cardiomegaly. Bilateral lower lobe airspace opacities, slightly worsened on the right since prior study. Possible small layering right effusion. No acute bony abnormality. IMPRESSION: Bibasilar airspace opacities, worsening on the right. Question small right effusion. Electronically Signed   By: Charlett Nose M.D.   On: 09/09/2020 10:54   DG CHEST PORT 1 VIEW  Result Date: 09/08/2020 CLINICAL DATA:  Infection EXAM: PORTABLE CHEST 1 VIEW COMPARISON:  None. FINDINGS: The heart size and mediastinal contours are mildly enlarged. ETT is 2.7 cm above the carina. NG tube is seen coursing below the diaphragm. Mildly increased interstitial markings are seen throughout both lungs.  Probable trace left pleural effusion is seen. No acute osseous abnormality. IMPRESSION: ET tube and NG tube in satisfactory position. Mildly increased interstitial opacities, likely interstitial edema. Trace left pleural effusion. Electronically Signed   By: Jonna Clark  M.D.   On: 09/08/2020 16:02   DG CHEST PORT 1 VIEW  Result Date: 09/08/2020 CLINICAL DATA:  Endotracheal tube placement. EXAM: PORTABLE CHEST 1 VIEW COMPARISON:  09/07/2020 FINDINGS: The endotracheal tube has tip 5.2 cm above the carina. Enteric tube courses into the stomach and off the film as tip is not visualized. Lungs are adequately inflated with persistent hazy bilateral perihilar opacification likely interstitial edema and less likely infection. No effusion. Mild stable cardiomegaly. Remainder of the exam is unchanged. IMPRESSION: 1. Persistent hazy bilateral perihilar opacification likely interstitial edema and less likely infection. 2. Tubes and lines as described. Electronically Signed   By: Elberta Fortisaniel  Boyle M.D.   On: 09/08/2020 14:45   DG CHEST PORT 1 VIEW  Result Date: 09/07/2020 CLINICAL DATA:  Respiratory distress. EXAM: PORTABLE CHEST 1 VIEW COMPARISON:  08/23/2020 FINDINGS: ET tube tip is above the carina. Stable cardiomediastinal contours. Diffuse pulmonary vascular congestion. Interval worsening aeration to the left midlung and left base which may represent airspace disease and or pleural effusion. IMPRESSION: Interval worsening aeration to the left midlung and left base which may represent airspace disease and/or pleural effusion. Electronically Signed   By: Signa Kellaylor  Stroud M.D.   On: 09/07/2020 10:48   DG Abd Portable 1V  Result Date: 09/08/2020 CLINICAL DATA:  Enteric tube placement. EXAM: PORTABLE ABDOMEN - 1 VIEW COMPARISON:  Chest x-ray earlier same day. FINDINGS: Interval placement of enteric tube with tip and side-port over the stomach in the left upper quadrant. Visualized bowel gas pattern is nonobstructive.  Remainder of the exam is unchanged. IMPRESSION: Enteric tube with tip and side-port over the stomach in the left upper quadrant. Nonobstructive bowel gas pattern. Electronically Signed   By: Elberta Fortisaniel  Boyle M.D.   On: 09/08/2020 14:46   ECHOCARDIOGRAM COMPLETE  Result Date: 09/07/2020    ECHOCARDIOGRAM REPORT   Patient Name:   Rennis Chrisrvin C Parcher Jr. Date of Exam: 09/07/2020 Medical Rec #:  [id number]          Height: Accession #:    [id number]         Weight:       275.6 lb Date of Birth:  1956-05-22         BSA:          2.500 m Patient Age:    63 years           BP:           122/62 mmHg Patient Gender: M                  HR:           120 bpm. Exam Location:  Inpatient Procedure: Intracardiac Opacification Agent and 2D Echo Indications:    Stroke 434.91 / I163.9  History:        Patient has no prior history of Echocardiogram examinations.                 CHF, Stroke; Risk Factors:Hypertension and Diabetes. Acute                 ischemic left MCA stroke , Acute Respiratory Failure.  Sonographer:    Jeryl ColumbiaJohanna Elliott Referring Phys: [id number] SUSHANTH R AROOR IMPRESSIONS  1. Left ventricular ejection fraction, by estimation, is 30 to 35%. The left ventricle has moderately decreased function. The left ventricle demonstrates regional wall motion abnormalities (see scoring diagram/findings for description). The left ventricular internal cavity size was mildly dilated. There is moderate left ventricular hypertrophy.  Left ventricular diastolic parameters are indeterminate.  2. Right ventricular systolic function is normal. The right ventricular size is normal.  3. The mitral valve is degenerative. Mild mitral valve regurgitation. No evidence of mitral stenosis. Moderate mitral annular calcification.  4. The aortic valve is tricuspid. Aortic valve regurgitation is not visualized. Mild to moderate aortic valve sclerosis/calcification is present, without any evidence of aortic stenosis.  5. The inferior vena cava is dilated in  size with >50% respiratory variability, suggesting right atrial pressure of 8 mmHg. Conclusion(s)/Recommendation(s): No intracardiac source of embolism detected on this transthoracic study. A transesophageal echocardiogram is recommended to exclude cardiac source of embolism if clinically indicated. FINDINGS  Left Ventricle: Left ventricular ejection fraction, by estimation, is 30 to 35%. The left ventricle has moderately decreased function. The left ventricle demonstrates regional wall motion abnormalities. The left ventricular internal cavity size was mildly dilated. There is moderate left ventricular hypertrophy. Left ventricular diastolic parameters are indeterminate.  LV Wall Scoring: The mid and distal anterior septum, entire apex, and mid and distal inferior wall are akinetic. Right Ventricle: The right ventricular size is normal. No increase in right ventricular wall thickness. Right ventricular systolic function is normal. Left Atrium: Left atrial size was normal in size. Right Atrium: Right atrial size was normal in size. Pericardium: There is no evidence of pericardial effusion. Mitral Valve: The mitral valve is degenerative in appearance. Moderate mitral annular calcification. Mild mitral valve regurgitation. No evidence of mitral valve stenosis. Tricuspid Valve: The tricuspid valve is normal in structure. Tricuspid valve regurgitation is not demonstrated. No evidence of tricuspid stenosis. Aortic Valve: The aortic valve is tricuspid. Aortic valve regurgitation is not visualized. Mild to moderate aortic valve sclerosis/calcification is present, without any evidence of aortic stenosis. Pulmonic Valve: The pulmonic valve was normal in structure. Pulmonic valve regurgitation is not visualized. No evidence of pulmonic stenosis. Aorta: The aortic root is normal in size and structure. Venous: The inferior vena cava is dilated in size with greater than 50% respiratory variability, suggesting right atrial  pressure of 8 mmHg. IAS/Shunts: No atrial level shunt detected by color flow Doppler.  LEFT VENTRICLE PLAX 2D LVIDd:         5.70 cm  Diastology LVIDs:         4.20 cm  LV e' medial:    15.00 cm/s LV PW:         1.40 cm  LV E/e' medial:  7.5 LV IVS:        1.40 cm  LV e' lateral:   14.10 cm/s LVOT diam:     2.00 cm  LV E/e' lateral: 7.9 LVOT Area:     3.14 cm  RIGHT VENTRICLE RV S prime:     21.90 cm/s TAPSE (M-mode): 2.4 cm LEFT ATRIUM             Index       RIGHT ATRIUM           Index LA diam:        4.00 cm 1.60 cm/m  RA Area:     16.20 cm LA Vol (A2C):   68.8 ml 27.52 ml/m RA Volume:   44.70 ml  17.88 ml/m LA Vol (A4C):   95.9 ml 38.35 ml/m LA Biplane Vol: 85.2 ml 34.07 ml/m   AORTA Ao Root diam: 3.80 cm MITRAL VALVE MV Area (PHT): 5.97 cm     SHUNTS MV Decel Time: 127 msec     Systemic Diam: 2.00 cm MR  Peak grad: 47.9 mmHg MR Vmax:      346.00 cm/s MV E velocity: 112.00 cm/s Donato Schultz MD Electronically signed by Donato Schultz MD Signature Date/Time: 09/07/2020/1:40:29 PM    Final    IR PERCUTANEOUS ART THROMBECTOMY/INFUSION INTRACRANIAL INC DIAG ANGIO  Result Date: 09/07/2020 INDICATION: 63-year-ol male presents for mechanical thrombectomy of left emergent large vessel occlusion involving the left M1 EXAM: ULTRASOUND-GUIDED ACCESS RIGHT COMMON FEMORAL ARTERY CERVICAL AND CEREBRAL ANGIOGRAM MECHANICAL THROMBECTOMY LEFT MCA ANGIO-SEAL FOR HEMOSTASIS COMPARISON:  CT imaging same day MEDICATIONS: None ANESTHESIA/SEDATION: The anesthesia team was present to provide general endotracheal tube anesthesia and for patient monitoring during the procedure. Intubation was performed in negative pressure Bay in neuro IR holding. Left radial arterial line was performed by the anesthesia team. Interventional neuro radiology nursing staff was also present. CONTRAST:  60 cc contrast FLUOROSCOPY TIME:  Fluoroscopy Time: 8 minutes 48 seconds (778 mGy). COMPLICATIONS: None TECHNIQUE: Informed written consent was  obtained from the patient's family after a thorough discussion of the procedural risks, benefits and alternatives. Specific risks discussed include: Bleeding, infection, contrast reaction, kidney injury/failure, need for further procedure/surgery, arterial injury or dissection, embolization to new territory, intracranial hemorrhage (10-15% risk), neurologic deterioration, cardiopulmonary collapse, death. All questions were addressed. Maximal Sterile Barrier Technique was utilized including during the procedure including caps, mask, sterile gowns, sterile gloves, sterile drape, hand hygiene and skin antiseptic. A timeout was performed prior to the initiation of the procedure. The anesthesia team was present to provide general endotracheal tube anesthesia and for patient monitoring during the procedure. Interventional neuro radiology nursing staff was also present. FINDINGS: Initial Findings: Left common carotid artery:  Normal course caliber and contour. Left external carotid artery: Patent with antegrade flow. Left internal carotid artery: Normal course caliber and contour of the cervical portion. Vertical and petrous segment patent with normal course caliber contour. Cavernous segment patent. Clinoid segment patent. Antegrade flow of the ophthalmic artery. Ophthalmic segment patent. Terminus patent. Left MCA: No significant atherosclerotic changes are narrowing of the left MCA. There is a fairly early division/origin of the MCA, with an upgoing branch just beyond the lenticulostriate arteries, superior division. There is a orbital frontal or frontal polar branch in a downgoing configuration. The dominant branch is the inferior division, which is apparently the dominant division given its supply to the parietal territory after reperfusion. Of note, there is an M4 occlusion of the early superior branch, which remains occluded throughout. Left ACA: A 1 segment patent. A 2 segment perfuses the right territory.  Significant p.o. collaterals are identified, attempting to reperfuse the parietal territory and the temporal territory from the ACA distribution in the watershed area. Completion Findings: Left MCA: Complete reperfusion of the dominant inferior division of the M1 with no embolization new territory. Flat panel CT: No hemorrhage. There is stagnant focus of contrast associated with the M4 occlusion, within a separate arterial territory than the treated M1 segment. PROCEDURE: The anesthesia team was present to provide general endotracheal tube anesthesia and for patient monitoring during the procedure. Intubation was performed in negative pressure Bay in neuro IR holding. Interventional neuro radiology nursing staff was also present. Ultrasound survey of the right inguinal region was performed with images stored and sent to PACs. 11 blade scalpel was used to make a small incision. Blunt dissection was performed with US guidance. A micropuncture needle was used access the right common femoral artery under ultrasound. With excellent arterial blood flow returned, an .018 micro wire was passed through  the needle, observed to enter the abdominal aorta under fluoroscopy. The needle was removed, and a micropuncture sheath was placed over the wire. The inner dilator and wire were removed, and an 035 wire was advanced under fluoroscopy into the abdominal aorta. The sheath was removed and a 25cm 75F straight vascular sheath was placed. The dilator was removed and the sheath was flushed. Sheath was attached to pressurized and heparinized saline bag for constant forward flow. A coaxial system was then advanced over the 035 wire. This included a 95cm 087 "Walrus" balloon guide with coaxial 125cm Berenstein diagnostic catheter. This was advanced to the proximal descending thoracic aorta. Wire was then removed. Double flush of the catheter was performed. Catheter was then used to select the left common carotid artery. Angiogram was  performed. Using roadmap technique, the catheter was advanced over a standard glide wire into left cervical ICA, with distal position achieved of the balloon guide. The diagnostic catheter and the wire were removed. Formal angiogram was performed. Road map function was used once the occluded vessel was identified. Copious back flush was performed and the balloon catheter was attached to heparinized and pressurized saline bag for forward flow. A second coaxial system was then advanced through the balloon catheter, which included the selected intermediate catheter, microcatheter, and microwire. In this scenario, the set up included a 137cm zoom 71 intermediate catheter, a Trevo Pro18 microcatheter, and 014 synchro soft wire. This system was advanced through the balloon guide catheter under the road-map function, with adequate back-flush at the rotating hemostatic valve at that back end of the balloon guide. Microcatheter and the intermediate catheter system were advanced through the terminal ICA and MCA to the level of the occlusion. The micro wire was then carefully advanced up to the occluded segment. Intermediate catheter was then advanced on the microcatheter and wire combination, up to the proximal face of the thrombus. The microcatheter and microwire were then gently removed and the rotating hemostatic valve was removed from the intermediate catheter. Direct aspiration was then performed through the zoom catheter. Zoom catheter was then gently withdrawn with full aspiration, while manipulating the balloon guide more proximally within the cervical artery. Zoom catheter was slowly withdrawn from the balloon guide. Angiogram was performed, confirming complete restoration of flow. Angiogram of the cervical ICA was performed. Balloon guide was then removed. The skin at the puncture site was then cleaned with Chlorhexidine. The 8 French sheath was removed and an 75F angioseal was deployed. Flat panel CT was performed.  Patient was extubated once the CT was reviewed. Patient tolerated the procedure well and remained hemodynamically stable throughout. No complications were encountered and no significant blood loss encountered. IMPRESSION: Status post ultrasound guided access right common femoral artery for left-sided cervical/cerebral angiogram and mechanical thrombectomy of left distal M1 occlusion using direct aspiration technique and achieving complete reperfusion of the occluded vessel. Angio-Seal for hemostasis. PLAN: The patient will remain intubated, given his comorbidities ICU status Target systolic blood pressure of 120-140 Right hip straight time 6 hours Frequent neurovascular checks Repeat neurologic imaging with CT and/MRI at the discretion of neurology team Electronically Signed   By: Gilmer Mor D.O.   On: 09/07/2020 10:18    PHYSICAL EXAM   Temp:  [99.3 F (37.4 C)-101.1 F (38.4 C)] 99.3 F (37.4 C) (09/21 0800) Pulse Rate:  [83-112] 89 (09/21 0900) Resp:  [17-31] 17 (09/21 0900) BP: (109-140)/(62-86) 113/66 (09/21 0934) SpO2:  [96 %-100 %] 97 % (09/21 0900) FiO2 (%):  [  40 %] 40 % (09/21 0934) Weight:  [124.2 kg] 124.2 kg (09/21 0500)  General - Well nourished, well developed, intubated on low dose propofol.  Ophthalmologic - fundi not visualized due to noncooperation.  Cardiovascular - Regular rate and rhythm.  Neuro - intubated on low dose propofol, eyes open, but not following commands. Eyes in mid position, however, with left gaze preference. Inconsistently blinking to visual threat bilaterally, doll's eyes present, PERRL. Corneal reflex present, gag and cough present. Breathing over the vent on weaning trial.  Facial symmetry not able to test due to ET tube.  Tongue protrusion not cooperative. Spontaneously moving LUE against gravity. On pain stimulation, RUE flaccid and LLE withdraw more than RLE withdraw, both did not against gravity. DTR 1+ and no babinski. Sensation, coordination not  cooperative and gait not tested.   ASSESSMENT/PLAN Terry Howell. is a 64 y.o. male with history of hypertension, hyperlipidemia, uncontrolled insulin-dependent diabetes mellitus, history of coronary artery disease status post PCI, heart failure presented to the ED at [facility] with sudden onset aphasia and right-sided weakness. Tele-neurology consult -> IV tPA at [facility]. Tx'd to [facility] for IR intervention. 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 Occult afib  CT Head - no acute finding     CTA H&N - OSH - Lt M1 occlusion   IR left M1 occlusion with TICI3 reperfusion  MRI head - Left lenticulostriate territory infarcts with patchy petechial hemorrhage and mildly expanded left basal ganglia. Elsewhere there are a few scattered punctate and occasionally patchy infarcts in the Left MCA territory.  MRA head - Left MCA patent  2D Echo EF 30-35%  Cardiology recommend outpt cardiac monitoring to rule out afib  EEG no seizure  [employer] Virus 2 - neg OSH  LDL - 50  HgbA1c - 8.7  UDS - neg  VTE prophylaxis - SCDs  No antithrombotic prior to admission, now on ASA 325 via OG.  Therapy recommendations:  CIR  Disposition:  Pending  Respiratory failure d/t pulmonary edema  Intubated for procedure  Continue to be intubated postprocedure  Failed the weaning trials x 2 -> now improved on weaning  CCM on board  On diuresis  Hope to extubate today   Cardiomyopathy Acute Systolic CHF Hx of CAD s/p stent 17 years ago  Has not followed with cardiology  TTE EF 30-35%  Could be related to current stroke  On lasix 40 Q12h  CXR 9/19 mild edema   CXR 9/20 mild R>L airspace opacity, ? R effusion  Cardiology onboard - recommend outpt cardiac monitoring to rule out afib  Hx of  hypertension Hypotension  Home BP meds: none   Current BP meds: none   Off Neo now  BP stable  On diuresis per CCM . Long-term BP goal normotensive  Hyperlipidemia  Home Lipid lowering medication: none   LDL 50, goal < 70  On Pravachol 20  Continue statin at discharge  Diabetes type II  Home diabetic meds: none   HgbA1c 8.7, goal < 7.0  SSI 0-20  CBG monitoring  Hyperglycemia 200-300s  Put on lantus 30->40 bid   Close PCP follow up for better DM control  Leukocytosis and fever  WBC 19.4->20.6->17.6->18.7  Tmax 101.1  UA neg  Resp Cx 9/19 reincubated  CXR 9/19 mild edema  CXR 9/20 mild R>L airspace opacity, ? R effusion  BCx 9/20 G+ cocci   On cefepime 9/22>  Dysphagia   Intubated now  On OG tube now  On diuresis  On tube feeding   Speech to follow after extubation  Other Stroke Risk Factors  Advanced age  Coronary artery disease  Congestive Heart Failure  Other Active Problems  Code status - DNR  Anemia - Hgb - 10.5->10.4->10.3->10.3  Decreased pedal pulses, palpable but weak  On Desyrel 100 hs  Hypokalemia K 3.4 - supplement   Hospital day # 4  This patient is critically ill due to respiratory failure, left MCA stroke status post thrombectomy, bacteremia, dysphagia, cardiomyopathy and at significant risk of neurological worsening, death form sepsis, endocarditis, recurrent stroke, hemorrhagic conversion, seizure, aspiration pneumonia. This patient's care requires constant monitoring of vital signs, hemodynamics, respiratory and cardiac monitoring, review of multiple databases, neurological assessment, discussion with family, other specialists and medical decision making of high complexity. I spent 30 minutes of neurocritical care time in the care of this patient.  I discussed with Dr. Judeth Horn.  Marvel Plan, MD PhD Stroke Neurology 09/10/2020 9:59 AM  To contact Stroke Continuity provider, please refer to  [email or web address]. After hours, contact General Neurology

## 2020-09-10 NOTE — Significant Event (Signed)
Worsening WOB, more tachypnea. CXR ordered, pending. Spoke at length with wife. He has portable DNR and has been adamant about no resuscitation. He would not want to be in the hospital at any time. Discussed comfort measures which he would want if worsens. If not improving will move to CC. Discussed that we should speak again tomorrow and can move to comfort care at any time. Given his stroke and difficulty speaking, swallowing, this would not be acceptable QOL per wife.

## 2020-09-10 NOTE — Progress Notes (Signed)
Patient with intermittent runs of SVT, CCM MD notified and Amiodarone gtt ordered.   Aris Lot, RN

## 2020-09-10 NOTE — Progress Notes (Signed)
Progress Note  Patient Name: Terry Howell. Date of Encounter: 09/10/2020  Primary Cardiologist: New, lives in [place], f/u [place] No primary care provider on file.  Subjective   Sedation is minimal, but pt not easily rousable  Inpatient Medications    Scheduled Meds: .  stroke: mapping our early stages of recovery book   Does not apply Once  . aspirin  325 mg Per Tube Daily  . chlorhexidine gluconate (MEDLINE KIT)  15 mL Mouth Rinse BID  . Chlorhexidine Gluconate Cloth  6 each Topical Daily  . feeding supplement (PROSource TF)  90 mL Per Tube TID  . influenza vac split quadrivalent PF  0.5 mL Intramuscular Tomorrow-1000  . insulin glargine  30 Units Subcutaneous Daily  . mouth rinse  15 mL Mouth Rinse 10 times per day  . pantoprazole (PROTONIX) IV  40 mg Intravenous QHS  . pneumococcal 23 valent vaccine  0.5 mL Intramuscular Tomorrow-1000  . pravastatin  20 mg Oral q1800   Continuous Infusions: . feeding supplement (VITAL HIGH PROTEIN) 50 mL/hr at 09/10/20 0700  . insulin 5.5 mL/hr at 09/10/20 0900  . propofol (DIPRIVAN) infusion 10 mcg/kg/min (09/10/20 0900)   PRN Meds: acetaminophen **OR** acetaminophen (TYLENOL) oral liquid 160 mg/5 mL **OR** acetaminophen, dextrose, fentaNYL (SUBLIMAZE) injection, senna-docusate   Vital Signs    Vitals:   09/10/20 0700 09/10/20 0800 09/10/20 0900 09/10/20 0934  BP: 117/66 114/66 113/66 113/66  Pulse: 86 90 89   Resp: 19 (!) 22 17   Temp:  99.3 F (37.4 C)    TempSrc:  Axillary    SpO2: 97% 98% 97%   Weight:        Intake/Output Summary (Last 24 hours) at 09/10/2020 0941 Last data filed at 09/10/2020 0900 Gross per 24 hour  Intake 1522.52 ml  Output 2500 ml  Net -977.48 ml   Filed Weights   09/13/2020 2315 09/09/20 0500 09/10/20 0500  Weight: 125 kg 124.4 kg 124.2 kg   Last Weight  Most recent update: 09/10/2020  6:31 AM   Weight  124.2 kg (273 lb 13 oz)           Weight change: -0.2 kg   Telemetry      SR, ST, PVCs and pairs - Personally Reviewed  ECG    None today - Personally Reviewed  Physical Exam   GEN: No acute distress, sleeping on the vent.   Neck: No JVD seen, difficult to assess 2nd body habitus Cardiac: RRR, no murmur, no rubs, or gallops. Pedal pulses weak Respiratory: few rales anteriorly. GI: Soft, nontender, non-distended  MS: No edema; No deformity. Neuro:  Sleeping, on the vent Psych: could not assess  Labs    Hematology Recent Labs  Lab 09/08/20 0706 09/09/20 0357 09/10/20 0658  WBC 20.6* 17.6* 18.7*  RBC 3.17* 3.20* 3.23*  HGB 10.4* 10.3* 10.3*  HCT 32.9* 32.9* 33.2*  MCV 103.8* 102.8* 102.8*  MCH 32.8 32.2 31.9  MCHC 31.6 31.3 31.0  RDW 14.6 14.6 14.5  PLT 185 186 185    Chemistry Recent Labs  Lab 09/09/20 0357 09/09/20 1710 09/10/20 0658  NA 142 145 148*  K 3.8 3.5 3.4*  CL 109 109 110  CO2 $Re'22 23 25  'jTE$ GLUCOSE 378* 438* 195*  BUN 39* 42* 45*  CREATININE 1.14 1.12 0.98  CALCIUM 8.2* 8.4* 8.3*  GFRNONAA >60 >60 >60  GFRAA >60 >60 >60  ANIONGAP $RemoveB'11 13 13     'XUaPFJeC$ High Sensitivity Troponin:  No results for input(s): TROPONINIHS in the last 720 hours.    BNP Recent Labs  Lab 09/08/20 0706  BNP 611.1*    Lab Results  Component Value Date   CHOL 97 09/07/2020   HDL 30 (L) 09/07/2020   LDLCALC 50 09/07/2020   TRIG 86 09/07/2020   CHOLHDL 3.2 09/07/2020   Lab Results  Component Value Date   HGBA1C 8.7 (H) 09/07/2020   No results found for: TSH  DDimer No results for input(s): DDIMER in the last 168 hours.   Radiology    MR ANGIO HEAD WO CONTRAST  Result Date: 09/07/2020 CLINICAL DATA:  64 year old male code stroke presentation yesterday with left MCA ELVO at [facility]. Status post IV tPA and endovascular revascularization. EXAM: MRI HEAD WITHOUT CONTRAST MRA HEAD WITHOUT CONTRAST TECHNIQUE: Multiplanar, multiecho pulse sequences of the brain and surrounding structures were obtained without intravenous contrast.  Angiographic images of the head were obtained using MRA technique without contrast. COMPARISON:  [facility] CT head and CTA head and neck yesterday. FINDINGS: MRI HEAD FINDINGS Brain: Patchy restricted diffusion and petechial hemorrhage in the left basal ganglia which are mildly expanded (series 6, image 85, series 14, image 32). Heidelberg Classification type 1c vs. type 2 petechial blood. Mild additional mostly punctate but occasional patchy scattered restricted diffusion in the superior left temporal lobe, posterior left insula. No additional blood products. No contralateral or posterior fossa restricted diffusion. No midline shift, ventriculomegaly, extra-axial collection. Outside of the acute findings the gray and white matter signal is largely normal for age. Cervicomedullary junction and pituitary are within normal limits. Vascular: Major intracranial vascular flow voids are preserved. Skull and upper cervical spine: Negative for age visible cervical spine. Visualized bone marrow signal is within normal limits. Sinuses/Orbits: Postoperative changes to both globes. Minor ethmoid and sphenoid sinus disease. Other: Intubated. Small volume of fluid layering in the pharynx. Mastoids are clear. Visible internal auditory structures appear normal. MRA HEAD FINDINGS Antegrade flow in the posterior circulation with mildly dominant left vertebral artery. No distal vertebral or basilar stenosis. Patent PICA origins, AICA, SCA and PCA origins. Posterior communicating arteries are diminutive or absent. Bilateral PCA branches are within normal limits. Antegrade flow in both ICA siphons. No siphon stenosis. Normal ophthalmic artery origins. Patent carotid termini. Patent MCA and ACA origins. Anterior communicating artery, visible ACA branches, right MCA M1 and visible right MCA branches are within normal limits. Patent left MCA M1 and bifurcation now. No significant vessel irregularity. Visible left MCA branches are  within normal limits. IMPRESSION: 1. Left lenticulostriate territory infarcts with patchy petechial hemorrhage and mildly expanded left basal ganglia. No significant intracranial mass effect. Elsewhere there are a few scattered punctate and occasionally patchy infarcts in the Left MCA territory. 2. Normal Left MCA and negative intracranial MRA. 3. Outside of the left MCA territory negative for age MRI appearance of the brain. Electronically Signed   By: Genevie Ann M.D.   On: 09/07/2020 19:22   MR BRAIN WO CONTRAST  Result Date: 09/07/2020 CLINICAL DATA:  64 year old male code stroke presentation yesterday with left MCA ELVO at [facility]. Status post IV tPA and endovascular revascularization. EXAM: MRI HEAD WITHOUT CONTRAST MRA HEAD WITHOUT CONTRAST TECHNIQUE: Multiplanar, multiecho pulse sequences of the brain and surrounding structures were obtained without intravenous contrast. Angiographic images of the head were obtained using MRA technique without contrast. COMPARISON:  [facility] CT head and CTA head and neck yesterday. FINDINGS: MRI HEAD FINDINGS Brain: Patchy restricted diffusion  and petechial hemorrhage in the left basal ganglia which are mildly expanded (series 6, image 85, series 14, image 32). Heidelberg Classification type 1c vs. type 2 petechial blood. Mild additional mostly punctate but occasional patchy scattered restricted diffusion in the superior left temporal lobe, posterior left insula. No additional blood products. No contralateral or posterior fossa restricted diffusion. No midline shift, ventriculomegaly, extra-axial collection. Outside of the acute findings the gray and white matter signal is largely normal for age. Cervicomedullary junction and pituitary are within normal limits. Vascular: Major intracranial vascular flow voids are preserved. Skull and upper cervical spine: Negative for age visible cervical spine. Visualized bone marrow signal is within normal limits.  Sinuses/Orbits: Postoperative changes to both globes. Minor ethmoid and sphenoid sinus disease. Other: Intubated. Small volume of fluid layering in the pharynx. Mastoids are clear. Visible internal auditory structures appear normal. MRA HEAD FINDINGS Antegrade flow in the posterior circulation with mildly dominant left vertebral artery. No distal vertebral or basilar stenosis. Patent PICA origins, AICA, SCA and PCA origins. Posterior communicating arteries are diminutive or absent. Bilateral PCA branches are within normal limits. Antegrade flow in both ICA siphons. No siphon stenosis. Normal ophthalmic artery origins. Patent carotid termini. Patent MCA and ACA origins. Anterior communicating artery, visible ACA branches, right MCA M1 and visible right MCA branches are within normal limits. Patent left MCA M1 and bifurcation now. No significant vessel irregularity. Visible left MCA branches are within normal limits. IMPRESSION: 1. Left lenticulostriate territory infarcts with patchy petechial hemorrhage and mildly expanded left basal ganglia. No significant intracranial mass effect. Elsewhere there are a few scattered punctate and occasionally patchy infarcts in the Left MCA territory. 2. Normal Left MCA and negative intracranial MRA. 3. Outside of the left MCA territory negative for age MRI appearance of the brain. Electronically Signed   By: Odessa Fleming M.D.   On: 09/07/2020 19:22   IR CT Head Ltd  Result Date: 09/07/2020 INDICATION: 63-year-ol male presents for mechanical thrombectomy of left emergent large vessel occlusion involving the left M1 EXAM: ULTRASOUND-GUIDED ACCESS RIGHT COMMON FEMORAL ARTERY CERVICAL AND CEREBRAL ANGIOGRAM MECHANICAL THROMBECTOMY LEFT MCA ANGIO-SEAL FOR HEMOSTASIS COMPARISON:  CT imaging same day MEDICATIONS: None ANESTHESIA/SEDATION: The anesthesia team was present to provide general endotracheal tube anesthesia and for patient monitoring during the procedure. Intubation was performed  in negative pressure [place] in neuro IR holding. Left radial arterial line was performed by the anesthesia team. Interventional neuro radiology nursing staff was also present. CONTRAST:  60 cc contrast FLUOROSCOPY TIME:  Fluoroscopy Time: 8 minutes 48 seconds (778 mGy). COMPLICATIONS: None TECHNIQUE: Informed written consent was obtained from the patient's family after a thorough discussion of the procedural risks, benefits and alternatives. Specific risks discussed include: Bleeding, infection, contrast reaction, kidney injury/failure, need for further procedure/surgery, arterial injury or dissection, embolization to new territory, intracranial hemorrhage (10-15% risk), neurologic deterioration, cardiopulmonary collapse, death. All questions were addressed. Maximal Sterile Barrier Technique was utilized including during the procedure including caps, mask, sterile gowns, sterile gloves, sterile drape, hand hygiene and skin antiseptic. A timeout was performed prior to the initiation of the procedure. The anesthesia team was present to provide general endotracheal tube anesthesia and for patient monitoring during the procedure. Interventional neuro radiology nursing staff was also present. FINDINGS: Initial Findings: Left common carotid artery:  Normal course caliber and contour. Left external carotid artery: Patent with antegrade flow. Left internal carotid artery: Normal course caliber and contour of the cervical portion. Vertical and petrous segment patent with  normal course caliber contour. Cavernous segment patent. Clinoid segment patent. Antegrade flow of the ophthalmic artery. Ophthalmic segment patent. Terminus patent. Left MCA: No significant atherosclerotic changes are narrowing of the left MCA. There is a fairly early division/origin of the MCA, with an upgoing branch just beyond the lenticulostriate arteries, superior division. There is a orbital frontal or frontal polar branch in a downgoing configuration.  The dominant branch is the inferior division, which is apparently the dominant division given its supply to the parietal territory after reperfusion. Of note, there is an M4 occlusion of the early superior branch, which remains occluded throughout. Left ACA: A 1 segment patent. A 2 segment perfuses the right territory. Significant p.o. collaterals are identified, attempting to reperfuse the parietal territory and the temporal territory from the ACA distribution in the watershed area. Completion Findings: Left MCA: Complete reperfusion of the dominant inferior division of the M1 with no embolization new territory. Flat panel CT: No hemorrhage. There is stagnant focus of contrast associated with the M4 occlusion, within a separate arterial territory than the treated M1 segment. PROCEDURE: The anesthesia team was present to provide general endotracheal tube anesthesia and for patient monitoring during the procedure. Intubation was performed in negative pressure [place] in neuro IR holding. Interventional neuro radiology nursing staff was also present. Ultrasound survey of the right inguinal region was performed with images stored and sent to PACs. 11 blade scalpel was used to make a small incision. Blunt dissection was performed with US guidance. A micropuncture needle was used access the right common femoral artery under ultrasound. With excellent arterial blood flow returned, an .018 micro wire was passed through the needle, observed to enter the abdominal aorta under fluoroscopy. The needle was removed, and a micropuncture sheath was placed over the wire. The inner dilator and wire were removed, and an 035 wire was advanced under fluoroscopy into the abdominal aorta. The sheath was removed and a 25cm 67F straight vascular sheath was placed. The dilator was removed and the sheath was flushed. Sheath was attached to pressurized and heparinized saline bag for constant forward flow. A coaxial system was then advanced over  the 035 wire. This included a 95cm 087 "Walrus" balloon guide with coaxial 125cm Berenstein diagnostic catheter. This was advanced to the proximal descending thoracic aorta. Wire was then removed. Double flush of the catheter was performed. Catheter was then used to select the left common carotid artery. Angiogram was performed. Using roadmap technique, the catheter was advanced over a standard glide wire into left cervical ICA, with distal position achieved of the balloon guide. The diagnostic catheter and the wire were removed. Formal angiogram was performed. Road map function was used once the occluded vessel was identified. Copious back flush was performed and the balloon catheter was attached to heparinized and pressurized saline bag for forward flow. A second coaxial system was then advanced through the balloon catheter, which included the selected intermediate catheter, microcatheter, and microwire. In this scenario, the set up included a 137cm zoom 71 intermediate catheter, a Trevo Pro18 microcatheter, and 014 synchro soft wire. This system was advanced through the balloon guide catheter under the road-map function, with adequate back-flush at the rotating hemostatic valve at that back end of the balloon guide. Microcatheter and the intermediate catheter system were advanced through the terminal ICA and MCA to the level of the occlusion. The micro wire was then carefully advanced up to the occluded segment. Intermediate catheter was then advanced on the microcatheter and wire combination,  up to the proximal face of the thrombus. The microcatheter and microwire were then gently removed and the rotating hemostatic valve was removed from the intermediate catheter. Direct aspiration was then performed through the zoom catheter. Zoom catheter was then gently withdrawn with full aspiration, while manipulating the balloon guide more proximally within the cervical artery. Zoom catheter was slowly withdrawn from the  balloon guide. Angiogram was performed, confirming complete restoration of flow. Angiogram of the cervical ICA was performed. Balloon guide was then removed. The skin at the puncture site was then cleaned with Chlorhexidine. The 8 French sheath was removed and an 72F angioseal was deployed. Flat panel CT was performed. Patient was extubated once the CT was reviewed. Patient tolerated the procedure well and remained hemodynamically stable throughout. No complications were encountered and no significant blood loss encountered. IMPRESSION: Status post ultrasound guided access right common femoral artery for left-sided cervical/cerebral angiogram and mechanical thrombectomy of left distal M1 occlusion using direct aspiration technique and achieving complete reperfusion of the occluded vessel. Angio-Seal for hemostasis. PLAN: The patient will remain intubated, given his comorbidities ICU status Target systolic blood pressure of 120-140 Right hip straight time 6 hours Frequent neurovascular checks Repeat neurologic imaging with CT and/MRI at the discretion of neurology team Electronically Signed   By: Corrie Mckusick D.O.   On: 09/07/2020 10:18   IR US Guide Vasc Access Right  Result Date: 09/07/2020 INDICATION: 58-year-ol male presents for mechanical thrombectomy of left emergent large vessel occlusion involving the left M1 EXAM: ULTRASOUND-GUIDED ACCESS RIGHT COMMON FEMORAL ARTERY CERVICAL AND CEREBRAL ANGIOGRAM MECHANICAL THROMBECTOMY LEFT MCA ANGIO-SEAL FOR HEMOSTASIS COMPARISON:  CT imaging same day MEDICATIONS: None ANESTHESIA/SEDATION: The anesthesia team was present to provide general endotracheal tube anesthesia and for patient monitoring during the procedure. Intubation was performed in negative pressure [place] in neuro IR holding. Left radial arterial line was performed by the anesthesia team. Interventional neuro radiology nursing staff was also present. CONTRAST:  60 cc contrast FLUOROSCOPY TIME:  Fluoroscopy  Time: 8 minutes 48 seconds (778 mGy). COMPLICATIONS: None TECHNIQUE: Informed written consent was obtained from the patient's family after a thorough discussion of the procedural risks, benefits and alternatives. Specific risks discussed include: Bleeding, infection, contrast reaction, kidney injury/failure, need for further procedure/surgery, arterial injury or dissection, embolization to new territory, intracranial hemorrhage (10-15% risk), neurologic deterioration, cardiopulmonary collapse, death. All questions were addressed. Maximal Sterile Barrier Technique was utilized including during the procedure including caps, mask, sterile gowns, sterile gloves, sterile drape, hand hygiene and skin antiseptic. A timeout was performed prior to the initiation of the procedure. The anesthesia team was present to provide general endotracheal tube anesthesia and for patient monitoring during the procedure. Interventional neuro radiology nursing staff was also present. FINDINGS: Initial Findings: Left common carotid artery:  Normal course caliber and contour. Left external carotid artery: Patent with antegrade flow. Left internal carotid artery: Normal course caliber and contour of the cervical portion. Vertical and petrous segment patent with normal course caliber contour. Cavernous segment patent. Clinoid segment patent. Antegrade flow of the ophthalmic artery. Ophthalmic segment patent. Terminus patent. Left MCA: No significant atherosclerotic changes are narrowing of the left MCA. There is a fairly early division/origin of the MCA, with an upgoing branch just beyond the lenticulostriate arteries, superior division. There is a orbital frontal or frontal polar branch in a downgoing configuration. The dominant branch is the inferior division, which is apparently the dominant division given its supply to the parietal territory after reperfusion. Of note,  there is an M4 occlusion of the early superior branch, which remains  occluded throughout. Left ACA: A 1 segment patent. A 2 segment perfuses the right territory. Significant p.o. collaterals are identified, attempting to reperfuse the parietal territory and the temporal territory from the [place] distribution in the watershed area. Completion Findings: Left MCA: Complete reperfusion of the dominant inferior division of the M1 with no embolization new territory. Flat panel CT: No hemorrhage. There is stagnant focus of contrast associated with the M4 occlusion, within a separate arterial territory than the treated M1 segment. PROCEDURE: The anesthesia team was present to provide general endotracheal tube anesthesia and for patient monitoring during the procedure. Intubation was performed in negative pressure [place] in neuro IR holding. Interventional neuro radiology nursing staff was also present. Ultrasound survey of the right inguinal region was performed with images stored and sent to PACs. 11 blade scalpel was used to make a small incision. Blunt dissection was performed with US guidance. A micropuncture needle was used access the right common femoral artery under ultrasound. With excellent arterial blood flow returned, an .018 micro wire was passed through the needle, observed to enter the abdominal aorta under fluoroscopy. The needle was removed, and a micropuncture sheath was placed over the wire. The inner dilator and wire were removed, and an 035 wire was advanced under fluoroscopy into the abdominal aorta. The sheath was removed and a 25cm 75F straight vascular sheath was placed. The dilator was removed and the sheath was flushed. Sheath was attached to pressurized and heparinized saline bag for constant forward flow. A coaxial system was then advanced over the 035 wire. This included a 95cm 087 "Walrus" balloon guide with coaxial 125cm Berenstein diagnostic catheter. This was advanced to the proximal descending thoracic aorta. Wire was then removed. Double flush of the catheter was  performed. Catheter was then used to select the left common carotid artery. Angiogram was performed. Using roadmap technique, the catheter was advanced over a standard glide wire into left cervical ICA, with distal position achieved of the balloon guide. The diagnostic catheter and the wire were removed. Formal angiogram was performed. Road map function was used once the occluded vessel was identified. Copious back flush was performed and the balloon catheter was attached to heparinized and pressurized saline bag for forward flow. A second coaxial system was then advanced through the balloon catheter, which included the selected intermediate catheter, microcatheter, and microwire. In this scenario, the set up included a 137cm zoom 71 intermediate catheter, a Trevo Pro18 microcatheter, and 014 synchro soft wire. This system was advanced through the balloon guide catheter under the road-map function, with adequate back-flush at the rotating hemostatic valve at that back end of the balloon guide. Microcatheter and the intermediate catheter system were advanced through the terminal ICA and MCA to the level of the occlusion. The micro wire was then carefully advanced up to the occluded segment. Intermediate catheter was then advanced on the microcatheter and wire combination, up to the proximal face of the thrombus. The microcatheter and microwire were then gently removed and the rotating hemostatic valve was removed from the intermediate catheter. Direct aspiration was then performed through the zoom catheter. Zoom catheter was then gently withdrawn with full aspiration, while manipulating the balloon guide more proximally within the cervical artery. Zoom catheter was slowly withdrawn from the balloon guide. Angiogram was performed, confirming complete restoration of flow. Angiogram of the cervical ICA was performed. Balloon guide was then removed. The skin at the puncture  site was then cleaned with Chlorhexidine. The 8  French sheath was removed and an 83F angioseal was deployed. Flat panel CT was performed. Patient was extubated once the CT was reviewed. Patient tolerated the procedure well and remained hemodynamically stable throughout. No complications were encountered and no significant blood loss encountered. IMPRESSION: Status post ultrasound guided access right common femoral artery for left-sided cervical/cerebral angiogram and mechanical thrombectomy of left distal M1 occlusion using direct aspiration technique and achieving complete reperfusion of the occluded vessel. Angio-Seal for hemostasis. PLAN: The patient will remain intubated, given his comorbidities ICU status Target systolic blood pressure of 120-140 Right hip straight time 6 hours Frequent neurovascular checks Repeat neurologic imaging with CT and/MRI at the discretion of neurology team Electronically Signed   By: Gilmer Mor D.O.   On: 09/07/2020 10:18   DG CHEST PORT 1 VIEW  Result Date: 09/09/2020 CLINICAL DATA:  Tachycardia EXAM: PORTABLE CHEST 1 VIEW COMPARISON:  09/08/2020 FINDINGS: Endotracheal tube and NG tube remain in place, unchanged. Cardiomegaly. Bilateral lower lobe airspace opacities, slightly worsened on the right since prior study. Possible small layering right effusion. No acute bony abnormality. IMPRESSION: Bibasilar airspace opacities, worsening on the right. Question small right effusion. Electronically Signed   By: Charlett Nose M.D.   On: 09/09/2020 10:54   DG CHEST PORT 1 VIEW  Result Date: 09/08/2020 CLINICAL DATA:  Infection EXAM: PORTABLE CHEST 1 VIEW COMPARISON:  None. FINDINGS: The heart size and mediastinal contours are mildly enlarged. ETT is 2.7 cm above the carina. NG tube is seen coursing below the diaphragm. Mildly increased interstitial markings are seen throughout both lungs. Probable trace left pleural effusion is seen. No acute osseous abnormality. IMPRESSION: ET tube and NG tube in satisfactory position. Mildly  increased interstitial opacities, likely interstitial edema. Trace left pleural effusion. Electronically Signed   By: Jonna Clark M.D.   On: 09/08/2020 16:02   DG CHEST PORT 1 VIEW  Result Date: 09/08/2020 CLINICAL DATA:  Endotracheal tube placement. EXAM: PORTABLE CHEST 1 VIEW COMPARISON:  09/07/2020 FINDINGS: The endotracheal tube has tip 5.2 cm above the carina. Enteric tube courses into the stomach and off the film as tip is not visualized. Lungs are adequately inflated with persistent hazy bilateral perihilar opacification likely interstitial edema and less likely infection. No effusion. Mild stable cardiomegaly. Remainder of the exam is unchanged. IMPRESSION: 1. Persistent hazy bilateral perihilar opacification likely interstitial edema and less likely infection. 2. Tubes and lines as described. Electronically Signed   By: Elberta Fortis M.D.   On: 09/08/2020 14:45   DG CHEST PORT 1 VIEW  Result Date: 09/07/2020 CLINICAL DATA:  Respiratory distress. EXAM: PORTABLE CHEST 1 VIEW COMPARISON:  09/09/2020 FINDINGS: ET tube tip is above the carina. Stable cardiomediastinal contours. Diffuse pulmonary vascular congestion. Interval worsening aeration to the left midlung and left base which may represent airspace disease and or pleural effusion. IMPRESSION: Interval worsening aeration to the left midlung and left base which may represent airspace disease and/or pleural effusion. Electronically Signed   By: Signa Kell M.D.   On: 09/07/2020 10:48   DG Abd Portable 1V  Result Date: 09/08/2020 CLINICAL DATA:  Enteric tube placement. EXAM: PORTABLE ABDOMEN - 1 VIEW COMPARISON:  Chest x-ray earlier same day. FINDINGS: Interval placement of enteric tube with tip and side-port over the stomach in the left upper quadrant. Visualized bowel gas pattern is nonobstructive. Remainder of the exam is unchanged. IMPRESSION: Enteric tube with tip and side-port over the stomach in  the left upper quadrant. Nonobstructive  bowel gas pattern. Electronically Signed   By: Elberta Fortis M.D.   On: 09/08/2020 14:46   ECHOCARDIOGRAM COMPLETE  Result Date: 09/07/2020    ECHOCARDIOGRAM REPORT   Patient Name:   Terry Howell. Date of Exam: 09/07/2020 Medical Rec #:  [id number]          Height: Accession #:    [id number]         Weight:       275.6 lb Date of Birth:  Dec 05, 1956         BSA:          2.500 m Patient Age:    63 years           BP:           122/62 mmHg Patient Gender: M                  HR:           120 bpm. Exam Location:  Inpatient Procedure: Intracardiac Opacification Agent and 2D Echo Indications:    Stroke 434.91 / I163.9  History:        Patient has no prior history of Echocardiogram examinations.                 CHF, Stroke; Risk Factors:Hypertension and Diabetes. Acute                 ischemic left MCA stroke , Acute Respiratory Failure.  Sonographer:    Jeryl Columbia Referring Phys: [id number] SUSHANTH R AROOR IMPRESSIONS  1. Left ventricular ejection fraction, by estimation, is 30 to 35%. The left ventricle has moderately decreased function. The left ventricle demonstrates regional wall motion abnormalities (see scoring diagram/findings for description). The left ventricular internal cavity size was mildly dilated. There is moderate left ventricular hypertrophy. Left ventricular diastolic parameters are indeterminate.  2. Right ventricular systolic function is normal. The right ventricular size is normal.  3. The mitral valve is degenerative. Mild mitral valve regurgitation. No evidence of mitral stenosis. Moderate mitral annular calcification.  4. The aortic valve is tricuspid. Aortic valve regurgitation is not visualized. Mild to moderate aortic valve sclerosis/calcification is present, without any evidence of aortic stenosis.  5. The inferior vena cava is dilated in size with >50% respiratory variability, suggesting right atrial pressure of 8 mmHg. Conclusion(s)/Recommendation(s): No intracardiac source of  embolism detected on this transthoracic study. A transesophageal echocardiogram is recommended to exclude cardiac source of embolism if clinically indicated. FINDINGS  Left Ventricle: Left ventricular ejection fraction, by estimation, is 30 to 35%. The left ventricle has moderately decreased function. The left ventricle demonstrates regional wall motion abnormalities. The left ventricular internal cavity size was mildly dilated. There is moderate left ventricular hypertrophy. Left ventricular diastolic parameters are indeterminate.  LV Wall Scoring: The mid and distal anterior septum, entire apex, and mid and distal inferior wall are akinetic. Right Ventricle: The right ventricular size is normal. No increase in right ventricular wall thickness. Right ventricular systolic function is normal. Left Atrium: Left atrial size was normal in size. Right Atrium: Right atrial size was normal in size. Pericardium: There is no evidence of pericardial effusion. Mitral Valve: The mitral valve is degenerative in appearance. Moderate mitral annular calcification. Mild mitral valve regurgitation. No evidence of mitral valve stenosis. Tricuspid Valve: The tricuspid valve is normal in structure. Tricuspid valve regurgitation is not demonstrated. No evidence of tricuspid stenosis. Aortic Valve: The aortic valve is  tricuspid. Aortic valve regurgitation is not visualized. Mild to moderate aortic valve sclerosis/calcification is present, without any evidence of aortic stenosis. Pulmonic Valve: The pulmonic valve was normal in structure. Pulmonic valve regurgitation is not visualized. No evidence of pulmonic stenosis. Aorta: The aortic root is normal in size and structure. Venous: The inferior vena cava is dilated in size with greater than 50% respiratory variability, suggesting right atrial pressure of 8 mmHg. IAS/Shunts: No atrial level shunt detected by color flow Doppler.  LEFT VENTRICLE PLAX 2D LVIDd:         5.70 cm  Diastology  LVIDs:         4.20 cm  LV e' medial:    15.00 cm/s LV PW:         1.40 cm  LV E/e' medial:  7.5 LV IVS:        1.40 cm  LV e' lateral:   14.10 cm/s LVOT diam:     2.00 cm  LV E/e' lateral: 7.9 LVOT Area:     3.14 cm  RIGHT VENTRICLE RV S prime:     21.90 cm/s TAPSE (M-mode): 2.4 cm LEFT ATRIUM             Index       RIGHT ATRIUM           Index LA diam:        4.00 cm 1.60 cm/m  RA Area:     16.20 cm LA Vol (A2C):   68.8 ml 27.52 ml/m RA Volume:   44.70 ml  17.88 ml/m LA Vol (A4C):   95.9 ml 38.35 ml/m LA Biplane Vol: 85.2 ml 34.07 ml/m   AORTA Ao Root diam: 3.80 cm MITRAL VALVE MV Area (PHT): 5.97 cm     SHUNTS MV Decel Time: 127 msec     Systemic Diam: 2.00 cm MR Peak grad: 47.9 mmHg MR Vmax:      346.00 cm/s MV E velocity: 112.00 cm/s Candee Furbish MD Electronically signed by Candee Furbish MD Signature Date/Time: 09/07/2020/1:40:29 PM    Final    IR PERCUTANEOUS ART THROMBECTOMY/INFUSION INTRACRANIAL INC DIAG ANGIO  Result Date: 09/07/2020 INDICATION: 34-year-ol male presents for mechanical thrombectomy of left emergent large vessel occlusion involving the left M1 EXAM: ULTRASOUND-GUIDED ACCESS RIGHT COMMON FEMORAL ARTERY CERVICAL AND CEREBRAL ANGIOGRAM MECHANICAL THROMBECTOMY LEFT MCA ANGIO-SEAL FOR HEMOSTASIS COMPARISON:  CT imaging same day MEDICATIONS: None ANESTHESIA/SEDATION: The anesthesia team was present to provide general endotracheal tube anesthesia and for patient monitoring during the procedure. Intubation was performed in negative pressure [place] in neuro IR holding. Left radial arterial line was performed by the anesthesia team. Interventional neuro radiology nursing staff was also present. CONTRAST:  60 cc contrast FLUOROSCOPY TIME:  Fluoroscopy Time: 8 minutes 48 seconds (778 mGy). COMPLICATIONS: None TECHNIQUE: Informed written consent was obtained from the patient's family after a thorough discussion of the procedural risks, benefits and alternatives. Specific risks discussed include:  Bleeding, infection, contrast reaction, kidney injury/failure, need for further procedure/surgery, arterial injury or dissection, embolization to new territory, intracranial hemorrhage (10-15% risk), neurologic deterioration, cardiopulmonary collapse, death. All questions were addressed. Maximal Sterile Barrier Technique was utilized including during the procedure including caps, mask, sterile gowns, sterile gloves, sterile drape, hand hygiene and skin antiseptic. A timeout was performed prior to the initiation of the procedure. The anesthesia team was present to provide general endotracheal tube anesthesia and for patient monitoring during the procedure. Interventional neuro radiology nursing staff was also present. FINDINGS: Initial Findings:  Left common carotid artery:  Normal course caliber and contour. Left external carotid artery: Patent with antegrade flow. Left internal carotid artery: Normal course caliber and contour of the cervical portion. Vertical and petrous segment patent with normal course caliber contour. Cavernous segment patent. Clinoid segment patent. Antegrade flow of the ophthalmic artery. Ophthalmic segment patent. Terminus patent. Left MCA: No significant atherosclerotic changes are narrowing of the left MCA. There is a fairly early division/origin of the MCA, with an upgoing branch just beyond the lenticulostriate arteries, superior division. There is a orbital frontal or frontal polar branch in a downgoing configuration. The dominant branch is the inferior division, which is apparently the dominant division given its supply to the parietal territory after reperfusion. Of note, there is an M4 occlusion of the early superior branch, which remains occluded throughout. Left ACA: A 1 segment patent. A 2 segment perfuses the right territory. Significant p.o. collaterals are identified, attempting to reperfuse the parietal territory and the temporal territory from the [place] distribution in the  watershed area. Completion Findings: Left MCA: Complete reperfusion of the dominant inferior division of the M1 with no embolization new territory. Flat panel CT: No hemorrhage. There is stagnant focus of contrast associated with the M4 occlusion, within a separate arterial territory than the treated M1 segment. PROCEDURE: The anesthesia team was present to provide general endotracheal tube anesthesia and for patient monitoring during the procedure. Intubation was performed in negative pressure [place] in neuro IR holding. Interventional neuro radiology nursing staff was also present. Ultrasound survey of the right inguinal region was performed with images stored and sent to PACs. 11 blade scalpel was used to make a small incision. Blunt dissection was performed with US guidance. A micropuncture needle was used access the right common femoral artery under ultrasound. With excellent arterial blood flow returned, an .018 micro wire was passed through the needle, observed to enter the abdominal aorta under fluoroscopy. The needle was removed, and a micropuncture sheath was placed over the wire. The inner dilator and wire were removed, and an 035 wire was advanced under fluoroscopy into the abdominal aorta. The sheath was removed and a 25cm 60F straight vascular sheath was placed. The dilator was removed and the sheath was flushed. Sheath was attached to pressurized and heparinized saline bag for constant forward flow. A coaxial system was then advanced over the 035 wire. This included a 95cm 087 "Walrus" balloon guide with coaxial 125cm Berenstein diagnostic catheter. This was advanced to the proximal descending thoracic aorta. Wire was then removed. Double flush of the catheter was performed. Catheter was then used to select the left common carotid artery. Angiogram was performed. Using roadmap technique, the catheter was advanced over a standard glide wire into left cervical ICA, with distal position achieved of the  balloon guide. The diagnostic catheter and the wire were removed. Formal angiogram was performed. Road map function was used once the occluded vessel was identified. Copious back flush was performed and the balloon catheter was attached to heparinized and pressurized saline bag for forward flow. A second coaxial system was then advanced through the balloon catheter, which included the selected intermediate catheter, microcatheter, and microwire. In this scenario, the set up included a 137cm zoom 71 intermediate catheter, a Trevo Pro18 microcatheter, and 014 synchro soft wire. This system was advanced through the balloon guide catheter under the road-map function, with adequate back-flush at the rotating hemostatic valve at that back end of the balloon guide. Microcatheter and the intermediate catheter system  were advanced through the terminal ICA and MCA to the level of the occlusion. The micro wire was then carefully advanced up to the occluded segment. Intermediate catheter was then advanced on the microcatheter and wire combination, up to the proximal face of the thrombus. The microcatheter and microwire were then gently removed and the rotating hemostatic valve was removed from the intermediate catheter. Direct aspiration was then performed through the zoom catheter. Zoom catheter was then gently withdrawn with full aspiration, while manipulating the balloon guide more proximally within the cervical artery. Zoom catheter was slowly withdrawn from the balloon guide. Angiogram was performed, confirming complete restoration of flow. Angiogram of the cervical ICA was performed. Balloon guide was then removed. The skin at the puncture site was then cleaned with Chlorhexidine. The 8 French sheath was removed and an 5F angioseal was deployed. Flat panel CT was performed. Patient was extubated once the CT was reviewed. Patient tolerated the procedure well and remained hemodynamically stable throughout. No complications  were encountered and no significant blood loss encountered. IMPRESSION: Status post ultrasound guided access right common femoral artery for left-sided cervical/cerebral angiogram and mechanical thrombectomy of left distal M1 occlusion using direct aspiration technique and achieving complete reperfusion of the occluded vessel. Angio-Seal for hemostasis. PLAN: The patient will remain intubated, given his comorbidities ICU status Target systolic blood pressure of 120-140 Right hip straight time 6 hours Frequent neurovascular checks Repeat neurologic imaging with CT and/MRI at the discretion of neurology team Electronically Signed   By: Corrie Mckusick D.O.   On: 09/07/2020 10:18     Cardiac Studies   ECHO:  09/07/2020 1. Left ventricular ejection fraction, by estimation, is 30 to 35%. The  left ventricle has moderately decreased function. The left ventricle  demonstrates regional wall motion abnormalities (see scoring  diagram/findings for description). The left  ventricular internal cavity size was mildly dilated. There is moderate  left ventricular hypertrophy. Left ventricular diastolic parameters are  indeterminate.  2. Right ventricular systolic function is normal. The right ventricular  size is normal.  3. The mitral valve is degenerative. Mild mitral valve regurgitation. No  evidence of mitral stenosis. Moderate mitral annular calcification.  4. The aortic valve is tricuspid. Aortic valve regurgitation is not  visualized. Mild to moderate aortic valve sclerosis/calcification is  present, without any evidence of aortic stenosis.  5. The inferior vena cava is dilated in size with >50% respiratory  variability, suggesting right atrial pressure of 8 mmHg.   Conclusion(s)/Recommendation(s): No intracardiac source of embolism  detected on this transthoracic study. A transesophageal echocardiogram is  recommended to exclude cardiac source of embolism if clinically indicated.   Patient  Profile     64 y.o. male w/ hx CAD w/ "widowmaker" MI 2004 s/p stent at [facility], DM, HTN, CHF, went to [facility] w/ CVA >> [facility] 09/17 for thrombectomy, had resp failure >> ETT, cards saw 09/19 for CHF  Assessment & Plan    1. Acute systolic CHF - EF 36-64% by echo, a little lower than what wife said EF was at the time of his MI in 2004 - no recent cardiac care, CRFs are poorly controlled - CXR 09/20 w/ bibasilar air-space opacities, worsening on the R - will start Lasix 40 mg IV BID, follow BP, I/O, accurate wt difficult due to equipment - BUN trending up, Cr trending down - I/O net -2.8 L so far - possible extubation today, pt is weaning well - w/ CVA, defer ischemic eval for now -  no family present so cannot ask about f/u  2. Decreased pedal pulses - palpable, but weak  3. Hypokalemia - supplement w/ 40 meq qd for now  Otherwise, per Dr Erlinda Hong Active Problems:   Acute ischemic left MCA stroke ([place])    Signed, Rosaria Ferries , PA-C 9:41 AM 09/10/2020 Pager: [phone]

## 2020-09-10 NOTE — Progress Notes (Signed)
Patient desat to 87% on 6 L [place] with tachycardia and tachypnea. RN attempted to put patient on 10 L [place] with no increase in oxygen saturation. Patient has lots of secretions, NT suctioned x2 with copious thick secretions. Patient desat to 85%. Patient placed on 15L [place] and is 99% O2.  Will continue to monitor.

## 2020-09-10 NOTE — Consult Note (Signed)
NAME:  Terry Howell., MRN:  [id number], DOB:  07-May-1956, LOS: 4 ADMISSION DATE:  09/04/2020, CONSULTATION DATE:  09/18 REFERRING MD: Dr Laurence Slate , CHIEF COMPLAINT: MCA CVA  Brief History   64 year old white male that presented with acute onset CVA.  History of present illness   64 year old white male who presented from home to [facility].  Was discovered on the floor by the patient's wife after falling from standing position.  Patient presented with right-sided weakness aphasia and right facial droop.  Last known normal time was at 1600 hrs. 09/17.  Patient did have a cough for approximately 1 week but is Covid negative.  TPA was administered at 2003 hrs. on 08/24/2020.  Past Medical History  Hypertension Diabetes Hyperlipidemia  Consults:  Neurology  Procedures:  Thrombectomy  Significant Diagnostic Tests:  CT head 09/17: Impression: 1.  No acute intercranial hemorrhage or cortically based infarct identified but suspicious density of the left MCA M1 segment consider emergent large vessel occlusion in the appropriate setting. 2.  Brain parenchyma appears normal for age Interval Events:  Follows commands on left, not on right. Arouses to voice. Passed SBT. Extubated later in morning.   Objective   Blood pressure 106/64, pulse 99, temperature 99.3 F (37.4 C), temperature source Axillary, resp. rate (!) 22, weight 124.2 kg, SpO2 96 %.    Vent Mode: CPAP;PSV FiO2 (%):  [40 %] 40 % Set Rate:  [14 bmp] 14 bmp Vt Set:  [550 mL] 550 mL PEEP:  [5 cmH20] 5 cmH20 Pressure Support:  [5 cmH20] 5 cmH20 Plateau Pressure:  [15 cmH20-27 cmH20] 15 cmH20   Intake/Output Summary (Last 24 hours) at 09/10/2020 1416 Last data filed at 09/10/2020 1100 Gross per 24 hour  Intake 1354.39 ml  Output 1900 ml  Net -545.61 ml   Filed Weights   09/14/2020 2315 09/09/20 0500 09/10/20 0500  Weight: 125 kg 124.4 kg 124.2 kg    Examination: General: Atraumatic HENT:  Atraumatic/normocephalic.  Orally intubated.  Mucous membranes are moist. Lungs:Mechanical sounds.  Bilateral breath sounds intact. Cardiovascular: Regular rate no rub murmur gallop appreciated. Abdomen: Soft, nondistended, positive bowel sounds, no rebound/rigidity/guarding the limited by neurologic status. Extremities: warm trace edema GU: Foley catheter intact.    Assessment & Plan:  Acute respiratory failure: Due to pulmonary edema and post operative state. Extubated 9/21. On [place]. 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 Status post IV TPA --exam stable --PT/OT  Congestive heart failure w/ reduced EF with associated pulmonary edema --Diurese --Coreg to 6.25, holding losartan in setting of borderline BPs  DM: Hyperglycemic on TFs --Start glargine, continue SSI  Best practice:  Diet: Tube feeds Pain/Anxiety/Delirium protocol (if indicated): propofol,  VAP protocol (if indicated): Initiated DVT prophylaxis: SCDs only GI prophylaxis: Protonix Glucose control: Insulin sliding scale Mobility: Bedrest Code Status: Full code Family Communication:   Disposition: ICU  Labs   CBC: Recent Labs  Lab 09/07/20 0336 09/07/20 0935 09/08/20 0706 09/09/20 0357 09/10/20 0658  WBC  --  19.4* 20.6* 17.6* 18.7*  HGB 10.2* 10.5* 10.4* 10.3* 10.3*  HCT 30.0* 33.5* 32.9* 32.9* 33.2*  MCV  --  100.9* 103.8* 102.8* 102.8*  PLT  --  197 185 186 185    Basic Metabolic Panel: Recent Labs  Lab 09/07/20 0935 09/08/20 0706 09/08/20 1206 09/08/20 1651 09/09/20 0357 09/09/20 1710 09/10/20 0658  NA 141 141  --   --  142 145 148*  K 3.7 3.8  --   --  3.8 3.5 3.4*  CL 107 108  --   --  109 109 110  CO2 22 20*  --   --  22 23 25   GLUCOSE 162* 213*  --   --  378* 438* 195*  BUN 34* 31*  --   --  39* 42* 45*  CREATININE 1.11 1.10  --   --  1.14 1.12 0.98  CALCIUM 8.4* 8.1*  --   --  8.2* 8.4* 8.3*  MG 1.9  --  1.8 1.8 2.0 1.9  --   PHOS 3.6  --  3.2 3.3 2.7 2.6  --     GFR: CrCl cannot be calculated (Unknown ideal weight.). Recent Labs  Lab 09/07/20 0935 09/08/20 0706 09/09/20 0357 09/10/20 0658  WBC 19.4* 20.6* 17.6* 18.7*    Liver Function Tests: No results for input(s): AST, ALT, ALKPHOS, BILITOT, PROT, ALBUMIN in the last 168 hours. No results for input(s): LIPASE, AMYLASE in the last 168 hours. No results for input(s): AMMONIA in the last 168 hours.  ABG    Component Value Date/Time   PHART 7.428 09/07/2020 0336   PCO2ART 35.5 09/07/2020 0336   PO2ART 306 (H) 09/07/2020 0336   HCO3 23.5 09/07/2020 0336   TCO2 25 09/07/2020 0336   ACIDBASEDEF 1.0 09/07/2020 0336   O2SAT 100.0 09/07/2020 0336     Coagulation Profile: No results for input(s): INR, PROTIME in the last 168 hours.  Cardiac Enzymes: No results for input(s): CKTOTAL, CKMB, CKMBINDEX, TROPONINI in the last 168 hours.  HbA1C: Hgb A1c MFr Bld  Date/Time Value Ref Range Status  09/07/2020 09:35 AM 8.7 (H) 4.8 - 5.6 % Final    Comment:    (NOTE) Pre diabetes:          5.7%-6.4%  Diabetes:              >6.4%  Glycemic control for   <7.0% adults with diabetes     CBG: Recent Labs  Lab 09/10/20 0646 09/10/20 0852 09/10/20 1108 09/10/20 1205 09/10/20 1403  GLUCAP 180* 159* 151* 183* 246*    Review of Systems:   Unable to be completed secondary to patient's neurologic status.  Past Medical History  Hypertension Diabetes mellitus type 2 Hyperlipidemia  Surgical History   Unknown  Social History  Unknown  Family History   His family history is not on file.   Allergies No Known Allergies   Home Medications  Prior to Admission medications   Not on File     Critical care time: CRITICAL CARE Performed by: 09/12/20   Total critical care time: 30 minutes  Critical care time was exclusive of separately billable procedures and treating other patients.  Critical care was necessary to treat or prevent imminent or life-threatening  deterioration.  Critical care was time spent personally by me on the following activities: development of treatment plan with patient and/or surrogate as well as nursing, discussions with consultants, evaluation of patient's response to treatment, examination of patient, obtaining history from patient or surrogate, ordering and performing treatments and interventions, ordering and review of laboratory studies, ordering and review of radiographic studies, pulse oximetry and re-evaluation of patient's condition.

## 2020-09-10 NOTE — Procedures (Signed)
Extubation Procedure Note  Patient Details:   Name: Terry Howell. DOB: 23-May-1956 MRN: [id number]   Airway Documentation:    Vent end date: 09/10/20 Vent end time: 1204   Evaluation  O2 sats: stable throughout Complications: No apparent complications Patient did tolerate procedure well. Bilateral Breath Sounds: Diminished   Yes   Extubated per MD order at this time. Able to make sounds and clear secretions, but is not really answering questions. RT to monitor.  Lurlean Leyden 09/10/2020, 12:05 PM

## 2020-09-10 NOTE — Progress Notes (Signed)
PHARMACY - PHYSICIAN COMMUNICATION CRITICAL VALUE ALERT - BLOOD CULTURE IDENTIFICATION (BCID)  Terry Lall Regino Fournet. is an 64 y.o. male who presented to [facility] on 08/28/2020 with a chief complaint of acute onset CVA.  Assessment:  Fell from standing position - presenting with R sided weakness and R facial droop. PMH HTN, DM, and HLP. 1/4 blood cx growing GPC with BCID showing staph species. Trach aspirate growing rare pseudomonas.   Name of physician (or Provider) Contacted: Dr Judeth Horn  Current antibiotics: Cefepime   Changes to prescribed antibiotics recommended:  No adjustments at this time - possible contaminant. Will continue cefepime for PNA.   Results for orders placed or performed during the hospital encounter of 09/04/2020  Blood Culture ID Panel (Reflexed) (Collected: 09/09/2020  5:16 PM)  Result Value Ref Range   Enterococcus faecalis NOT DETECTED NOT DETECTED   Enterococcus Faecium NOT DETECTED NOT DETECTED   Listeria monocytogenes NOT DETECTED NOT DETECTED   Staphylococcus species DETECTED (A) NOT DETECTED   Staphylococcus aureus (BCID) NOT DETECTED NOT DETECTED   Staphylococcus epidermidis NOT DETECTED NOT DETECTED   Staphylococcus lugdunensis NOT DETECTED NOT DETECTED   Streptococcus species NOT DETECTED NOT DETECTED   Streptococcus agalactiae NOT DETECTED NOT DETECTED   Streptococcus pneumoniae NOT DETECTED NOT DETECTED   Streptococcus pyogenes NOT DETECTED NOT DETECTED   A.calcoaceticus-baumannii NOT DETECTED NOT DETECTED   Bacteroides fragilis NOT DETECTED NOT DETECTED   Enterobacterales NOT DETECTED NOT DETECTED   Enterobacter cloacae complex NOT DETECTED NOT DETECTED   Escherichia coli NOT DETECTED NOT DETECTED   Klebsiella aerogenes NOT DETECTED NOT DETECTED   Klebsiella oxytoca NOT DETECTED NOT DETECTED   Klebsiella pneumoniae NOT DETECTED NOT DETECTED   Proteus species NOT DETECTED NOT DETECTED   Salmonella species NOT DETECTED NOT DETECTED   Serratia  marcescens NOT DETECTED NOT DETECTED   Haemophilus influenzae NOT DETECTED NOT DETECTED   Neisseria meningitidis NOT DETECTED NOT DETECTED   Pseudomonas aeruginosa NOT DETECTED NOT DETECTED   Stenotrophomonas maltophilia NOT DETECTED NOT DETECTED   Candida albicans NOT DETECTED NOT DETECTED   Candida auris NOT DETECTED NOT DETECTED   Candida glabrata NOT DETECTED NOT DETECTED   Candida krusei NOT DETECTED NOT DETECTED   Candida parapsilosis NOT DETECTED NOT DETECTED   Candida tropicalis NOT DETECTED NOT DETECTED   Cryptococcus neoformans/gattii NOT DETECTED NOT DETECTED    Sherron Monday, PharmD, BCCCP Clinical Pharmacist  Phone: [phone] 09/10/2020 5:15 PM  Please check AMION for all [facility] phone numbers After 10:00 PM, call Main Pharmacy [phone]

## 2020-09-10 NOTE — Progress Notes (Signed)
SLP Cancellation Note  Patient Details Name: Terry Howell. MRN: [id number] DOB: 1956/09/12   Cancelled treatment:       Reason Eval/Treat Not Completed: Medical issues which prohibited therapy (remains on vent).   Mahala Menghini., M.A. CCC-SLP Acute Rehabilitation Services Pager [phone] Office [phone]  09/10/2020, 8:11 AM

## 2020-09-11 DIAGNOSIS — Z7189 Other specified counseling: Secondary | ICD-10-CM

## 2020-09-11 DIAGNOSIS — I69391 Dysphagia following cerebral infarction: Secondary | ICD-10-CM

## 2020-09-11 DIAGNOSIS — D62 Acute posthemorrhagic anemia: Secondary | ICD-10-CM | POA: Diagnosis present

## 2020-09-11 DIAGNOSIS — I471 Supraventricular tachycardia, unspecified: Secondary | ICD-10-CM | POA: Diagnosis present

## 2020-09-11 DIAGNOSIS — I10 Essential (primary) hypertension: Secondary | ICD-10-CM | POA: Diagnosis present

## 2020-09-11 DIAGNOSIS — E876 Hypokalemia: Secondary | ICD-10-CM | POA: Diagnosis present

## 2020-09-11 DIAGNOSIS — E785 Hyperlipidemia, unspecified: Secondary | ICD-10-CM | POA: Diagnosis present

## 2020-09-11 DIAGNOSIS — J810 Acute pulmonary edema: Secondary | ICD-10-CM | POA: Diagnosis present

## 2020-09-11 DIAGNOSIS — IMO0002 Reserved for concepts with insufficient information to code with codable children: Secondary | ICD-10-CM | POA: Diagnosis present

## 2020-09-11 DIAGNOSIS — Z515 Encounter for palliative care: Secondary | ICD-10-CM

## 2020-09-11 DIAGNOSIS — I429 Cardiomyopathy, unspecified: Secondary | ICD-10-CM

## 2020-09-11 DIAGNOSIS — J9600 Acute respiratory failure, unspecified whether with hypoxia or hypercapnia: Secondary | ICD-10-CM | POA: Diagnosis not present

## 2020-09-11 DIAGNOSIS — R509 Fever, unspecified: Secondary | ICD-10-CM | POA: Diagnosis present

## 2020-09-11 DIAGNOSIS — D72829 Elevated white blood cell count, unspecified: Secondary | ICD-10-CM | POA: Diagnosis present

## 2020-09-11 LAB — BLOOD CULTURE ID PANEL (REFLEXED) - BCID2

## 2020-09-11 LAB — CULTURE, BLOOD (ROUTINE X 2): Special Requests: ADEQUATE

## 2020-09-11 LAB — GLUCOSE, CAPILLARY
Glucose-Capillary: 452 mg/dL — ABNORMAL HIGH (ref 70–99)
Glucose-Capillary: 343 mg/dL — ABNORMAL HIGH (ref 70–99)
Glucose-Capillary: 314 mg/dL — ABNORMAL HIGH (ref 70–99)
Glucose-Capillary: 270 mg/dL — ABNORMAL HIGH (ref 70–99)
Glucose-Capillary: 207 mg/dL — ABNORMAL HIGH (ref 70–99)
Glucose-Capillary: 181 mg/dL — ABNORMAL HIGH (ref 70–99)
Glucose-Capillary: 163 mg/dL — ABNORMAL HIGH (ref 70–99)
Glucose-Capillary: 137 mg/dL — ABNORMAL HIGH (ref 70–99)
Glucose-Capillary: 136 mg/dL — ABNORMAL HIGH (ref 70–99)

## 2020-09-11 LAB — CULTURE, RESPIRATORY W GRAM STAIN: Gram Stain: NONE SEEN

## 2020-09-11 MED ORDER — GLYCOPYRROLATE 1 MG PO TABS
1.0000 mg | ORAL_TABLET | ORAL | Status: DC | PRN
  Filled 2020-09-11: qty 1

## 2020-09-11 MED ORDER — MORPHINE SULFATE (PF) 2 MG/ML IV SOLN
2.0000 mg | INTRAVENOUS | Status: DC | PRN
  Filled 2020-09-11: qty 1

## 2020-09-11 MED ORDER — DEXTROSE 5 % IV SOLN: INTRAVENOUS | Status: DC

## 2020-09-11 MED ORDER — GLYCOPYRROLATE 0.2 MG/ML IJ SOLN: 0.2000 mg | INTRAMUSCULAR | Status: DC | PRN

## 2020-09-11 MED ORDER — INSULIN GLARGINE 100 UNIT/ML ~~LOC~~ SOLN
10.0000 [IU] | Freq: Two times a day (BID) | SUBCUTANEOUS | Status: DC
  Administered 2020-09-11: 10 [IU] via SUBCUTANEOUS
  Filled 2020-09-11 (×2): qty 0.1

## 2020-09-11 MED ORDER — ACETAMINOPHEN 325 MG PO TABS: 650.0000 mg | ORAL_TABLET | Freq: Four times a day (QID) | ORAL | Status: DC | PRN

## 2020-09-11 MED ORDER — DIPHENHYDRAMINE HCL 50 MG/ML IJ SOLN: 25.0000 mg | INTRAMUSCULAR | Status: DC | PRN

## 2020-09-11 MED ORDER — MORPHINE SULFATE (PF) 2 MG/ML IV SOLN
2.0000 mg | INTRAVENOUS | Status: DC | PRN
  Administered 2020-09-11: 2 mg via INTRAVENOUS

## 2020-09-11 MED ORDER — INSULIN REGULAR(HUMAN) IN NACL 100-0.9 UT/100ML-% IV SOLN
INTRAVENOUS | Status: DC
  Administered 2020-09-11: 18 [IU]/h via INTRAVENOUS
  Administered 2020-09-11: 23 [IU]/h via INTRAVENOUS
  Administered 2020-09-11: 24 [IU]/h via INTRAVENOUS
  Filled 2020-09-11 (×3): qty 100

## 2020-09-11 MED ORDER — ACETAMINOPHEN 650 MG RE SUPP: 650.0000 mg | Freq: Four times a day (QID) | RECTAL | Status: DC | PRN

## 2020-09-11 MED ORDER — DEXTROSE 10 % IV SOLN: INTRAVENOUS | Status: DC

## 2020-09-11 MED ORDER — POLYVINYL ALCOHOL 1.4 % OP SOLN
1.0000 [drp] | Freq: Four times a day (QID) | OPHTHALMIC | Status: DC | PRN
  Filled 2020-09-11: qty 15

## 2020-09-11 MED ORDER — MORPHINE BOLUS VIA INFUSION
5.0000 mg | INTRAVENOUS | Status: DC | PRN
  Filled 2020-09-11: qty 5

## 2020-09-11 MED ORDER — MIDAZOLAM HCL 2 MG/2ML IJ SOLN: 2.0000 mg | INTRAMUSCULAR | Status: DC | PRN

## 2020-09-11 MED ORDER — DEXTROSE 50 % IV SOLN: 0.0000 mL | INTRAVENOUS | Status: DC | PRN

## 2020-09-11 MED ORDER — MORPHINE 100MG IN NS 100ML (1MG/ML) PREMIX INFUSION
0.0000 mg/h | INTRAVENOUS | Status: DC
  Administered 2020-09-11: 5 mg/h via INTRAVENOUS
  Filled 2020-09-11: qty 100

## 2020-09-13 LAB — CULTURE, BLOOD (ROUTINE X 2)

## 2020-09-20 NOTE — Progress Notes (Signed)
PT Cancellation Note/PT DISCHARGE  Patient Details Name: Terry Howell. MRN: [id number] DOB: 04/18/1956   Cancelled Treatment:    Reason Eval/Treat Not Completed: Other (comment). Per RN pt transitioning to comfort care and is no longer appropriate for acute physical therapy services. PT SIGNING OFF. Please re-consult if needed in future.  Lewis Shock, PT, DPT Acute Rehabilitation Services Pager #: [phone] Office #: [phone]    Iona Hansen 09/14/2020, 9:23 AM

## 2020-09-20 NOTE — Progress Notes (Signed)
Noted PCCM discussion with spouse.  Palliative will be happy to follow and assist with comfort as needed.   Ocie Bob, AGNP-C Palliative Medicine  No charge note

## 2020-09-20 NOTE — Progress Notes (Signed)
NAME:  Terry Howell, Terry Howell MRN:  [id number], DOB:  06/23/1956, LOS: 5 ADMISSION DATE:  09/08/2020, CONSULTATION DATE:  09/18 REFERRING MD: Dr Laurence Slate , CHIEF COMPLAINT: MCA CVA  Brief History   64 year old white male that presented with acute onset CVA.  History of present illness   64 year old white male who presented from home to [facility].  Was discovered on the floor by the patient's wife after falling from standing position.  Patient presented with right-sided weakness aphasia and right facial droop.  Last known normal time was at 1600 hrs. 09/17.  Patient did have a cough for approximately 1 week but is Covid negative.  TPA was administered at 2003 hrs. on 09/19/2020.  Past Medical History  Hypertension Diabetes Hyperlipidemia  Consults:  Neurology  Procedures:  Thrombectomy  Significant Diagnostic Tests:  CT head 09/17: Impression: 1.  No acute intercranial hemorrhage or cortically based infarct identified but suspicious density of the left MCA M1 segment consider emergent large vessel occlusion in the appropriate setting. 2.  Brain parenchyma appears normal for age 63/18 MRI/MRA head:  1. Left lenticulostriate territory infarcts with patchy petechial hemorrhage and mildly expanded left basal ganglia. No significant intracranial mass effect. Elsewhere there are a few scattered punctate and occasionally patchy infarcts in the Left MCA territory.  2. Normal Left MCA and negative intracranial MRA.  3. Outside of the left MCA territory negative for age MRI appearance of the brain.   4.Antegrade flow in the posterior circulation with mildly dominant left vertebral artery. No distal vertebral or basilar stenosis. Patent PICA origins, AICA, SCA and PCA origins. Posterior communicating arteries are diminutive or absent. Bilateral PCA branches are within normal limits.  5.Antegrade flow in both ICA siphons. No siphon stenosis. Normal ophthalmic artery origins.  Patent carotid termini. Patent MCA and ACA origins. Anterior communicating artery, visible ACA branches, right MCA M1 and visible right MCA branches are within normal limits.  6.Patent left MCA M1 and bifurcation now. No significant vessel irregularity. Visible left MCA branches are within normal limits.  9/18 echo: LVEF 30-35%, moderate LVH, MV degenerative with regurgitation  Interval Events:  9/22: Not following commands this am. Awake but labored and rhonchi. On NRB. Appears uncomfortable. Palliative consulted. Transitioning off insulin gtt, will decrease lantus with npo status. Suspect will move to comfort in near future.   Objective   Blood pressure 129/84, pulse (!) 115, temperature 100.2 F (37.9 C), temperature source Axillary, resp. rate (!) 40, weight 124.2 kg, SpO2 97 %.    Vent Mode: CPAP;PSV FiO2 (%):  [40 %] 40 % PEEP:  [5 cmH20] 5 cmH20 Pressure Support:  [5 cmH20] 5 cmH20   Intake/Output Summary (Last 24 hours) at 02-Oct-2020 0806 Last data filed at 10/02/2020 0600 Gross per 24 hour  Intake 1124.7 ml  Output 1200 ml  Net -75.3 ml   Filed Weights   09/09/2020 2315 09/09/20 0500 09/10/20 0500  Weight: 125 kg 124.4 kg 124.2 kg    Examination: General: elderly male, diaphoretic and labored breathing on nrb HENT: Atraumatic/normocephalic.  Extubated on NRB  Mucous membranes are moist. eomi Lungs: diffuse rhonchi Cardiovascular: Regular rate no rub murmur gallop appreciated. Abdomen: Soft, nondistended, positive bowel sounds, no rebound/rigidity/guarding Extremities: warm + edema GU: Foley catheter intact.    Assessment & Plan:  Acute respiratory failure: Due to pulmonary edema and post operative state. Extubated 9/21.  -decompensating overnight and on NRB at this time.  -would benefit from morphine/versed for comfort at this time.  -  palliative care consulted but I am personally attempting to contact wife as pt appears so uncomfortable at this time and I would  like to transition him to comfort to ensure he is not suffering. Unfortunately no answer at this time.   Left M1 occlusive CVA Status post thrombectomy Status post IV TPA --not following commands today.  --PT/OT  Congestive heart failure w/ reduced EF with associated pulmonary edema --Diurese --Coreg to 6.25, holding losartan in setting of borderline BPs  DM: Hyperglycemic on TFs --transitioning off insulin gtt -starting d10 as npo with poor prognosis.   Best practice:  Diet:  Pain/Anxiety/Delirium protocol (if indicated): prn VAP protocol (if indicated): nap DVT prophylaxis: SCDs only GI prophylaxis: Protonix Glucose control: Insulin sliding scale Mobility: Bedrest Code Status: Full code Family Communication:  Have attempted to contact wife but no answer.  Disposition: ICU  Labs   CBC: Recent Labs  Lab 09/07/20 0336 09/07/20 0935 09/08/20 0706 09/09/20 0357 09/10/20 0658  WBC  --  19.4* 20.6* 17.6* 18.7*  HGB 10.2* 10.5* 10.4* 10.3* 10.3*  HCT 30.0* 33.5* 32.9* 32.9* 33.2*  MCV  --  100.9* 103.8* 102.8* 102.8*  PLT  --  197 185 186 185    Basic Metabolic Panel: Recent Labs  Lab 09/07/20 0935 09/08/20 0706 09/08/20 1206 09/08/20 1651 09/09/20 0357 09/09/20 1710 09/10/20 0658  NA 141 141  --   --  142 145 148*  K 3.7 3.8  --   --  3.8 3.5 3.4*  CL 107 108  --   --  109 109 110  CO2 22 20*  --   --  22 23 25   GLUCOSE 162* 213*  --   --  378* 438* 195*  BUN 34* 31*  --   --  39* 42* 45*  CREATININE 1.11 1.10  --   --  1.14 1.12 0.98  CALCIUM 8.4* 8.1*  --   --  8.2* 8.4* 8.3*  MG 1.9  --  1.8 1.8 2.0 1.9  --   PHOS 3.6  --  3.2 3.3 2.7 2.6  --    GFR: CrCl cannot be calculated (Unknown ideal weight.). Recent Labs  Lab 09/07/20 0935 09/08/20 0706 09/09/20 0357 09/10/20 0658  WBC 19.4* 20.6* 17.6* 18.7*    Liver Function Tests: No results for input(s): AST, ALT, ALKPHOS, BILITOT, PROT, ALBUMIN in the last 168 hours. No results for input(s):  LIPASE, AMYLASE in the last 168 hours. No results for input(s): AMMONIA in the last 168 hours.  ABG    Component Value Date/Time   PHART 7.428 09/07/2020 0336   PCO2ART 35.5 09/07/2020 0336   PO2ART 306 (H) 09/07/2020 0336   HCO3 23.5 09/07/2020 0336   TCO2 25 09/07/2020 0336   ACIDBASEDEF 1.0 09/07/2020 0336   O2SAT 100.0 09/07/2020 0336     Coagulation Profile: No results for input(s): INR, PROTIME in the last 168 hours.  Cardiac Enzymes: No results for input(s): CKTOTAL, CKMB, CKMBINDEX, TROPONINI in the last 168 hours.  HbA1C: Hgb A1c MFr Bld  Date/Time Value Ref Range Status  09/07/2020 09:35 AM 8.7 (H) 4.8 - 5.6 % Final    Comment:    (NOTE) Pre diabetes:          5.7%-6.4%  Diabetes:              >6.4%  Glycemic control for   <7.0% adults with diabetes     CBG: Recent Labs  Lab 09/09/2020 0359 09/05/2020 0502 09/18/2020 0555  Sep 28, 2020 0648 2020-09-28 0749  GLUCAP 270* 207* 181* 163* 137*    Review of Systems:   Unable to be completed secondary to patient's neurologic status.  Past Medical History  Hypertension Diabetes mellitus type 2 Hyperlipidemia  Surgical History   Unknown  Social History  Unknown  Family History   His family history is not on file.   Allergies No Known Allergies   Home Medications  Prior to Admission medications   Not on File     Critical care time: CRITICAL CARE Performed by: Briant Sites   Total critical care time: 37 minutes  Critical care time was exclusive of separately billable procedures and treating other patients.  Critical care was necessary to treat or prevent imminent or life-threatening deterioration.  Critical care was time spent personally by me on the following activities: development of treatment plan with patient and/or surrogate as well as nursing, discussions with consultants, evaluation of patient's response to treatment, examination of patient, obtaining history from patient or surrogate,  ordering and performing treatments and interventions, ordering and review of laboratory studies, ordering and review of radiographic studies, pulse oximetry and re-evaluation of patient's condition.

## 2020-09-20 NOTE — Progress Notes (Signed)
Terry Howell was declared dead at 87 by two RNs present at the bedside. Bailey Desanctis, wife, was notified of his death. Dr. Roda Shutters and Dr. Gaynell Face were also notified. IVs were removed. No patient belongings were at bedside. CDS was notified ([phone]). Ripley Fraise D

## 2020-09-20 NOTE — Progress Notes (Signed)
This RN notified E-Link RN, Sheria Lang, that patients CBG <250 on endotool. Will retake CBG at 0600 and if that is also <250 patient will be started on dextrose gtt. Sheria Lang, RN stated that she will check 0600 CBG after it has resulted and get the dextrose order put in. Will continue to monitor patient.

## 2020-09-20 NOTE — Progress Notes (Signed)
eLink Physician-Brief Progress Note Patient Name: Terry Howell. DOB: [birth year] MRN: [id number]   Date of Service  08/27/2020  HPI/Events of Note  Glucose 400s On basal and sliding scale insulin Creatinine 0.98  eICU Interventions  Initiate Endotool/insulin drip     Intervention Category Major Interventions: Hyperglycemia - active titration of insulin therapy  Darl Pikes 09/18/2020, 12:26 AM

## 2020-09-20 NOTE — Progress Notes (Signed)
Patient transitioning off of Endotool. Discussed transition with Dr. Gaynell Face. Lantus dose changed to 10 units and D10 @ 85mL/hr ordered. Will continue to monitor. Terry Howell

## 2020-09-20 NOTE — Progress Notes (Signed)
   Oct 02, 2020 1315  Clinical Encounter Type  Visited With Family  Visit Type Death  Referral From Nurse  Consult/Referral To Chaplain  Spiritual Encounters  Spiritual Needs Grief support  Stress Factors  Family Stress Factors Family relationships   Nurse let Chaplain know that Pt just passed and family was at bedside. Pt's family was grieving not having the opportunity to say goodbye. Chaplain honored Pt family's request for privacy. Chaplain remains available as needed.  This note was prepared by Chaplain Resident, Tacy Learn, MDiv. For questions, please contact by phone at [phone].

## 2020-09-20 NOTE — Death Summary Note (Signed)
Stroke Discharge Summary  Patient ID: Terry Howell   MRN: [id number]      DOB: 30-Jul-1956  Date of Admission: 2020-09-08 Date of Discharge: 08/27/2020  Attending Physician:  Marvel Plan, MD, Stroke MD  Consultant(s):   Dina Rich, MD (cardiology) Vilma Meckel, MD ( pulmonary/intensive care ), Ocie Bob NP (palliative care) Patient's PCP:  Marcy Panning, MD  DISCHARGE DIAGNOSIS:  Principal Problem:   Acute ischemic left MCA stroke [facility]) s/p tPA, embolic d/t unk source Active Problems:   Acute systolic CHF (congestive heart failure) (HCC)   Acute respiratory failure (HCC)   Advanced care planning/counseling discussion   Palliative care by specialist   Acute pulmonary edema (HCC)   Cardiomyopathy (HCC)   SVT (supraventricular tachycardia) (HCC)   Essential hypertension   Hyperlipidemia LDL goal <70   Diabetes type 2, uncontrolled (HCC)   Leukocytosis   Fever   Dysphagia due to recent cerebrovascular accident   Acute blood loss anemia   Hypokalemia  LABORATORY STUDIES CBC    Component Value Date/Time   WBC 18.7 (H) 09/10/2020 0658   RBC 3.23 (L) 09/10/2020 0658   HGB 10.3 (L) 09/10/2020 0658   HCT 33.2 (L) 09/10/2020 0658   PLT 185 09/10/2020 0658   MCV 102.8 (H) 09/10/2020 0658   MCH 31.9 09/10/2020 0658   MCHC 31.0 09/10/2020 0658   RDW 14.5 09/10/2020 0658   CMP    Component Value Date/Time   NA 148 (H) 09/10/2020 0658   K 3.4 (L) 09/10/2020 0658   CL 110 09/10/2020 0658   CO2 25 09/10/2020 0658   GLUCOSE 195 (H) 09/10/2020 0658   BUN 45 (H) 09/10/2020 0658   CREATININE 0.98 09/10/2020 0658   CALCIUM 8.3 (L) 09/10/2020 0658   GFRNONAA >60 09/10/2020 0658   GFRAA >60 09/10/2020 0658   COAGSNo results found for: INR, PROTIME Lipid Panel    Component Value Date/Time   CHOL 97 09/07/2020 0935   TRIG 86 09/07/2020 0935   HDL 30 (L) 09/07/2020 0935   CHOLHDL 3.2 09/07/2020 0935   VLDL 17 09/07/2020 0935   LDLCALC 50 09/07/2020 0935    HgbA1C  Lab Results  Component Value Date   HGBA1C 8.7 (H) 09/07/2020   Urinalysis    Component Value Date/Time   COLORURINE YELLOW 09/09/2020 1744   APPEARANCEUR CLEAR 09/09/2020 1744   LABSPEC 1.016 09/09/2020 1744   PHURINE 5.0 09/09/2020 1744   GLUCOSEU >=500 (A) 09/09/2020 1744   HGBUR NEGATIVE 09/09/2020 1744   BILIRUBINUR NEGATIVE 09/09/2020 1744   KETONESUR NEGATIVE 09/09/2020 1744   PROTEINUR NEGATIVE 09/09/2020 1744   NITRITE NEGATIVE 09/09/2020 1744   LEUKOCYTESUR NEGATIVE 09/09/2020 1744   Urine Drug Screen     Component Value Date/Time   LABOPIA NONE DETECTED 09/07/2020 0935   COCAINSCRNUR NONE DETECTED 09/07/2020 0935   LABBENZ NONE DETECTED 09/07/2020 0935   AMPHETMU NONE DETECTED 09/07/2020 0935   THCU NONE DETECTED 09/07/2020 0935   LABBARB NONE DETECTED 09/07/2020 0935     SIGNIFICANT DIAGNOSTIC STUDIES DG Chest 1 View  Result Date: 09/10/2020 CLINICAL DATA:  Hypoxemia EXAM: CHEST  1 VIEW COMPARISON:  Film from the previous day. FINDINGS: Endotracheal tube and gastric catheter have been removed in the interval. Cardiac shadow remains enlarged. Increasing vascular congestion and interstitial edema is seen. Enlarging right-sided pleural effusion is noted as well. Degenerative change of the thoracic spine is seen. IMPRESSION: Changes consistent with increasing CHF and right-sided pleural effusion. Electronically  Signed   By: Alcide Clever M.D.   On: 09/10/2020 19:30   MR ANGIO HEAD WO CONTRAST  Result Date: 09/07/2020 CLINICAL DATA:  64 year old male code stroke presentation yesterday with left MCA ELVO at [facility]. Status post IV tPA and endovascular revascularization. EXAM: MRI HEAD WITHOUT CONTRAST MRA HEAD WITHOUT CONTRAST TECHNIQUE: Multiplanar, multiecho pulse sequences of the brain and surrounding structures were obtained without intravenous contrast. Angiographic images of the head were obtained using MRA technique without contrast. COMPARISON:   [facility] CT head and CTA head and neck yesterday. FINDINGS: MRI HEAD FINDINGS Brain: Patchy restricted diffusion and petechial hemorrhage in the left basal ganglia which are mildly expanded (series 6, image 85, series 14, image 32). Heidelberg Classification type 1c vs. type 2 petechial blood. Mild additional mostly punctate but occasional patchy scattered restricted diffusion in the superior left temporal lobe, posterior left insula. No additional blood products. No contralateral or posterior fossa restricted diffusion. No midline shift, ventriculomegaly, extra-axial collection. Outside of the acute findings the gray and white matter signal is largely normal for age. Cervicomedullary junction and pituitary are within normal limits. Vascular: Major intracranial vascular flow voids are preserved. Skull and upper cervical spine: Negative for age visible cervical spine. Visualized bone marrow signal is within normal limits. Sinuses/Orbits: Postoperative changes to both globes. Minor ethmoid and sphenoid sinus disease. Other: Intubated. Small volume of fluid layering in the pharynx. Mastoids are clear. Visible internal auditory structures appear normal. MRA HEAD FINDINGS Antegrade flow in the posterior circulation with mildly dominant left vertebral artery. No distal vertebral or basilar stenosis. Patent PICA origins, AICA, SCA and PCA origins. Posterior communicating arteries are diminutive or absent. Bilateral PCA branches are within normal limits. Antegrade flow in both ICA siphons. No siphon stenosis. Normal ophthalmic artery origins. Patent carotid termini. Patent MCA and ACA origins. Anterior communicating artery, visible ACA branches, right MCA M1 and visible right MCA branches are within normal limits. Patent left MCA M1 and bifurcation now. No significant vessel irregularity. Visible left MCA branches are within normal limits. IMPRESSION: 1. Left lenticulostriate territory infarcts with patchy  petechial hemorrhage and mildly expanded left basal ganglia. No significant intracranial mass effect. Elsewhere there are a few scattered punctate and occasionally patchy infarcts in the Left MCA territory. 2. Normal Left MCA and negative intracranial MRA. 3. Outside of the left MCA territory negative for age MRI appearance of the brain. Electronically Signed   By: Odessa Fleming M.D.   On: 09/07/2020 19:22   MR BRAIN WO CONTRAST  Result Date: 09/07/2020 CLINICAL DATA:  64 year old male code stroke presentation yesterday with left MCA ELVO at [facility]. Status post IV tPA and endovascular revascularization. EXAM: MRI HEAD WITHOUT CONTRAST MRA HEAD WITHOUT CONTRAST TECHNIQUE: Multiplanar, multiecho pulse sequences of the brain and surrounding structures were obtained without intravenous contrast. Angiographic images of the head were obtained using MRA technique without contrast. COMPARISON:  [facility] CT head and CTA head and neck yesterday. FINDINGS: MRI HEAD FINDINGS Brain: Patchy restricted diffusion and petechial hemorrhage in the left basal ganglia which are mildly expanded (series 6, image 85, series 14, image 32). Heidelberg Classification type 1c vs. type 2 petechial blood. Mild additional mostly punctate but occasional patchy scattered restricted diffusion in the superior left temporal lobe, posterior left insula. No additional blood products. No contralateral or posterior fossa restricted diffusion. No midline shift, ventriculomegaly, extra-axial collection. Outside of the acute findings the gray and white matter signal is largely normal for age. Cervicomedullary  junction and pituitary are within normal limits. Vascular: Major intracranial vascular flow voids are preserved. Skull and upper cervical spine: Negative for age visible cervical spine. Visualized bone marrow signal is within normal limits. Sinuses/Orbits: Postoperative changes to both globes. Minor ethmoid and sphenoid sinus disease.  Other: Intubated. Small volume of fluid layering in the pharynx. Mastoids are clear. Visible internal auditory structures appear normal. MRA HEAD FINDINGS Antegrade flow in the posterior circulation with mildly dominant left vertebral artery. No distal vertebral or basilar stenosis. Patent PICA origins, AICA, SCA and PCA origins. Posterior communicating arteries are diminutive or absent. Bilateral PCA branches are within normal limits. Antegrade flow in both ICA siphons. No siphon stenosis. Normal ophthalmic artery origins. Patent carotid termini. Patent MCA and ACA origins. Anterior communicating artery, visible ACA branches, right MCA M1 and visible right MCA branches are within normal limits. Patent left MCA M1 and bifurcation now. No significant vessel irregularity. Visible left MCA branches are within normal limits. IMPRESSION: 1. Left lenticulostriate territory infarcts with patchy petechial hemorrhage and mildly expanded left basal ganglia. No significant intracranial mass effect. Elsewhere there are a few scattered punctate and occasionally patchy infarcts in the Left MCA territory. 2. Normal Left MCA and negative intracranial MRA. 3. Outside of the left MCA territory negative for age MRI appearance of the brain. Electronically Signed   By: Odessa Fleming M.D.   On: 09/07/2020 19:22   IR CT Head Ltd  Result Date: 09/07/2020 INDICATION: 63-year-ol male presents for mechanical thrombectomy of left emergent large vessel occlusion involving the left M1 EXAM: ULTRASOUND-GUIDED ACCESS RIGHT COMMON FEMORAL ARTERY CERVICAL AND CEREBRAL ANGIOGRAM MECHANICAL THROMBECTOMY LEFT MCA ANGIO-SEAL FOR HEMOSTASIS COMPARISON:  CT imaging same day MEDICATIONS: None ANESTHESIA/SEDATION: The anesthesia team was present to provide general endotracheal tube anesthesia and for patient monitoring during the procedure. Intubation was performed in negative pressure Bay in neuro IR holding. Left radial arterial line was performed by the  anesthesia team. Interventional neuro radiology nursing staff was also present. CONTRAST:  60 cc contrast FLUOROSCOPY TIME:  Fluoroscopy Time: 8 minutes 48 seconds (778 mGy). COMPLICATIONS: None TECHNIQUE: Informed written consent was obtained from the patient's family after a thorough discussion of the procedural risks, benefits and alternatives. Specific risks discussed include: Bleeding, infection, contrast reaction, kidney injury/failure, need for further procedure/surgery, arterial injury or dissection, embolization to new territory, intracranial hemorrhage (10-15% risk), neurologic deterioration, cardiopulmonary collapse, death. All questions were addressed. Maximal Sterile Barrier Technique was utilized including during the procedure including caps, mask, sterile gowns, sterile gloves, sterile drape, hand hygiene and skin antiseptic. A timeout was performed prior to the initiation of the procedure. The anesthesia team was present to provide general endotracheal tube anesthesia and for patient monitoring during the procedure. Interventional neuro radiology nursing staff was also present. FINDINGS: Initial Findings: Left common carotid artery:  Normal course caliber and contour. Left external carotid artery: Patent with antegrade flow. Left internal carotid artery: Normal course caliber and contour of the cervical portion. Vertical and petrous segment patent with normal course caliber contour. Cavernous segment patent. Clinoid segment patent. Antegrade flow of the ophthalmic artery. Ophthalmic segment patent. Terminus patent. Left MCA: No significant atherosclerotic changes are narrowing of the left MCA. There is a fairly early division/origin of the MCA, with an upgoing branch just beyond the lenticulostriate arteries, superior division. There is a orbital frontal or frontal polar branch in a downgoing configuration. The dominant branch is the inferior division, which is apparently the dominant division given  its  supply to the parietal territory after reperfusion. Of note, there is an M4 occlusion of the early superior branch, which remains occluded throughout. Left ACA: A 1 segment patent. A 2 segment perfuses the right territory. Significant p.o. collaterals are identified, attempting to reperfuse the parietal territory and the temporal territory from the ACA distribution in the watershed area. Completion Findings: Left MCA: Complete reperfusion of the dominant inferior division of the M1 with no embolization new territory. Flat panel CT: No hemorrhage. There is stagnant focus of contrast associated with the M4 occlusion, within a separate arterial territory than the treated M1 segment. PROCEDURE: The anesthesia team was present to provide general endotracheal tube anesthesia and for patient monitoring during the procedure. Intubation was performed in negative pressure Bay in neuro IR holding. Interventional neuro radiology nursing staff was also present. Ultrasound survey of the right inguinal region was performed with images stored and sent to PACs. 11 blade scalpel was used to make a small incision. Blunt dissection was performed with US guidance. A micropuncture needle was used access the right common femoral artery under ultrasound. With excellent arterial blood flow returned, an .018 micro wire was passed through the needle, observed to enter the abdominal aorta under fluoroscopy. The needle was removed, and a micropuncture sheath was placed over the wire. The inner dilator and wire were removed, and an 035 wire was advanced under fluoroscopy into the abdominal aorta. The sheath was removed and a 25cm 46F straight vascular sheath was placed. The dilator was removed and the sheath was flushed. Sheath was attached to pressurized and heparinized saline bag for constant forward flow. A coaxial system was then advanced over the 035 wire. This included a 95cm 087 "Walrus" balloon guide with coaxial 125cm Berenstein  diagnostic catheter. This was advanced to the proximal descending thoracic aorta. Wire was then removed. Double flush of the catheter was performed. Catheter was then used to select the left common carotid artery. Angiogram was performed. Using roadmap technique, the catheter was advanced over a standard glide wire into left cervical ICA, with distal position achieved of the balloon guide. The diagnostic catheter and the wire were removed. Formal angiogram was performed. Road map function was used once the occluded vessel was identified. Copious back flush was performed and the balloon catheter was attached to heparinized and pressurized saline bag for forward flow. A second coaxial system was then advanced through the balloon catheter, which included the selected intermediate catheter, microcatheter, and microwire. In this scenario, the set up included a 137cm zoom 71 intermediate catheter, a Trevo Pro18 microcatheter, and 014 synchro soft wire. This system was advanced through the balloon guide catheter under the road-map function, with adequate back-flush at the rotating hemostatic valve at that back end of the balloon guide. Microcatheter and the intermediate catheter system were advanced through the terminal ICA and MCA to the level of the occlusion. The micro wire was then carefully advanced up to the occluded segment. Intermediate catheter was then advanced on the microcatheter and wire combination, up to the proximal face of the thrombus. The microcatheter and microwire were then gently removed and the rotating hemostatic valve was removed from the intermediate catheter. Direct aspiration was then performed through the zoom catheter. Zoom catheter was then gently withdrawn with full aspiration, while manipulating the balloon guide more proximally within the cervical artery. Zoom catheter was slowly withdrawn from the balloon guide. Angiogram was performed, confirming complete restoration of flow. Angiogram  of the cervical ICA was performed. Balloon  guide was then removed. The skin at the puncture site was then cleaned with Chlorhexidine. The 8 French sheath was removed and an 32F angioseal was deployed. Flat panel CT was performed. Patient was extubated once the CT was reviewed. Patient tolerated the procedure well and remained hemodynamically stable throughout. No complications were encountered and no significant blood loss encountered. IMPRESSION: Status post ultrasound guided access right common femoral artery for left-sided cervical/cerebral angiogram and mechanical thrombectomy of left distal M1 occlusion using direct aspiration technique and achieving complete reperfusion of the occluded vessel. Angio-Seal for hemostasis. PLAN: The patient will remain intubated, given his comorbidities ICU status Target systolic blood pressure of 120-140 Right hip straight time 6 hours Frequent neurovascular checks Repeat neurologic imaging with CT and/MRI at the discretion of neurology team Electronically Signed   By: Gilmer Mor D.O.   On: 09/07/2020 10:18   IR US Guide Vasc Access Right  Result Date: 09/07/2020 INDICATION: 63-year-ol male presents for mechanical thrombectomy of left emergent large vessel occlusion involving the left M1 EXAM: ULTRASOUND-GUIDED ACCESS RIGHT COMMON FEMORAL ARTERY CERVICAL AND CEREBRAL ANGIOGRAM MECHANICAL THROMBECTOMY LEFT MCA ANGIO-SEAL FOR HEMOSTASIS COMPARISON:  CT imaging same day MEDICATIONS: None ANESTHESIA/SEDATION: The anesthesia team was present to provide general endotracheal tube anesthesia and for patient monitoring during the procedure. Intubation was performed in negative pressure Bay in neuro IR holding. Left radial arterial line was performed by the anesthesia team. Interventional neuro radiology nursing staff was also present. CONTRAST:  60 cc contrast FLUOROSCOPY TIME:  Fluoroscopy Time: 8 minutes 48 seconds (778 mGy). COMPLICATIONS: None TECHNIQUE: Informed written  consent was obtained from the patient's family after a thorough discussion of the procedural risks, benefits and alternatives. Specific risks discussed include: Bleeding, infection, contrast reaction, kidney injury/failure, need for further procedure/surgery, arterial injury or dissection, embolization to new territory, intracranial hemorrhage (10-15% risk), neurologic deterioration, cardiopulmonary collapse, death. All questions were addressed. Maximal Sterile Barrier Technique was utilized including during the procedure including caps, mask, sterile gowns, sterile gloves, sterile drape, hand hygiene and skin antiseptic. A timeout was performed prior to the initiation of the procedure. The anesthesia team was present to provide general endotracheal tube anesthesia and for patient monitoring during the procedure. Interventional neuro radiology nursing staff was also present. FINDINGS: Initial Findings: Left common carotid artery:  Normal course caliber and contour. Left external carotid artery: Patent with antegrade flow. Left internal carotid artery: Normal course caliber and contour of the cervical portion. Vertical and petrous segment patent with normal course caliber contour. Cavernous segment patent. Clinoid segment patent. Antegrade flow of the ophthalmic artery. Ophthalmic segment patent. Terminus patent. Left MCA: No significant atherosclerotic changes are narrowing of the left MCA. There is a fairly early division/origin of the MCA, with an upgoing branch just beyond the lenticulostriate arteries, superior division. There is a orbital frontal or frontal polar branch in a downgoing configuration. The dominant branch is the inferior division, which is apparently the dominant division given its supply to the parietal territory after reperfusion. Of note, there is an M4 occlusion of the early superior branch, which remains occluded throughout. Left ACA: A 1 segment patent. A 2 segment perfuses the right  territory. Significant p.o. collaterals are identified, attempting to reperfuse the parietal territory and the temporal territory from the ACA distribution in the watershed area. Completion Findings: Left MCA: Complete reperfusion of the dominant inferior division of the M1 with no embolization new territory. Flat panel CT: No hemorrhage. There is stagnant focus of contrast associated  with the M4 occlusion, within a separate arterial territory than the treated M1 segment. PROCEDURE: The anesthesia team was present to provide general endotracheal tube anesthesia and for patient monitoring during the procedure. Intubation was performed in negative pressure Bay in neuro IR holding. Interventional neuro radiology nursing staff was also present. Ultrasound survey of the right inguinal region was performed with images stored and sent to PACs. 11 blade scalpel was used to make a small incision. Blunt dissection was performed with US guidance. A micropuncture needle was used access the right common femoral artery under ultrasound. With excellent arterial blood flow returned, an .018 micro wire was passed through the needle, observed to enter the abdominal aorta under fluoroscopy. The needle was removed, and a micropuncture sheath was placed over the wire. The inner dilator and wire were removed, and an 035 wire was advanced under fluoroscopy into the abdominal aorta. The sheath was removed and a 25cm 70F straight vascular sheath was placed. The dilator was removed and the sheath was flushed. Sheath was attached to pressurized and heparinized saline bag for constant forward flow. A coaxial system was then advanced over the 035 wire. This included a 95cm 087 "Walrus" balloon guide with coaxial 125cm Berenstein diagnostic catheter. This was advanced to the proximal descending thoracic aorta. Wire was then removed. Double flush of the catheter was performed. Catheter was then used to select the left common carotid artery.  Angiogram was performed. Using roadmap technique, the catheter was advanced over a standard glide wire into left cervical ICA, with distal position achieved of the balloon guide. The diagnostic catheter and the wire were removed. Formal angiogram was performed. Road map function was used once the occluded vessel was identified. Copious back flush was performed and the balloon catheter was attached to heparinized and pressurized saline bag for forward flow. A second coaxial system was then advanced through the balloon catheter, which included the selected intermediate catheter, microcatheter, and microwire. In this scenario, the set up included a 137cm zoom 71 intermediate catheter, a Trevo Pro18 microcatheter, and 014 synchro soft wire. This system was advanced through the balloon guide catheter under the road-map function, with adequate back-flush at the rotating hemostatic valve at that back end of the balloon guide. Microcatheter and the intermediate catheter system were advanced through the terminal ICA and MCA to the level of the occlusion. The micro wire was then carefully advanced up to the occluded segment. Intermediate catheter was then advanced on the microcatheter and wire combination, up to the proximal face of the thrombus. The microcatheter and microwire were then gently removed and the rotating hemostatic valve was removed from the intermediate catheter. Direct aspiration was then performed through the zoom catheter. Zoom catheter was then gently withdrawn with full aspiration, while manipulating the balloon guide more proximally within the cervical artery. Zoom catheter was slowly withdrawn from the balloon guide. Angiogram was performed, confirming complete restoration of flow. Angiogram of the cervical ICA was performed. Balloon guide was then removed. The skin at the puncture site was then cleaned with Chlorhexidine. The 8 French sheath was removed and an 70F angioseal was deployed. Flat panel CT  was performed. Patient was extubated once the CT was reviewed. Patient tolerated the procedure well and remained hemodynamically stable throughout. No complications were encountered and no significant blood loss encountered. IMPRESSION: Status post ultrasound guided access right common femoral artery for left-sided cervical/cerebral angiogram and mechanical thrombectomy of left distal M1 occlusion using direct aspiration technique and achieving complete reperfusion  of the occluded vessel. Angio-Seal for hemostasis. PLAN: The patient will remain intubated, given his comorbidities ICU status Target systolic blood pressure of 120-140 Right hip straight time 6 hours Frequent neurovascular checks Repeat neurologic imaging with CT and/MRI at the discretion of neurology team Electronically Signed   By: Gilmer Mor D.O.   On: 09/07/2020 10:18   DG CHEST PORT 1 VIEW  Result Date: 09/09/2020 CLINICAL DATA:  Tachycardia EXAM: PORTABLE CHEST 1 VIEW COMPARISON:  09/08/2020 FINDINGS: Endotracheal tube and NG tube remain in place, unchanged. Cardiomegaly. Bilateral lower lobe airspace opacities, slightly worsened on the right since prior study. Possible small layering right effusion. No acute bony abnormality. IMPRESSION: Bibasilar airspace opacities, worsening on the right. Question small right effusion. Electronically Signed   By: Charlett Nose M.D.   On: 09/09/2020 10:54   DG CHEST PORT 1 VIEW  Result Date: 09/08/2020 CLINICAL DATA:  Infection EXAM: PORTABLE CHEST 1 VIEW COMPARISON:  None. FINDINGS: The heart size and mediastinal contours are mildly enlarged. ETT is 2.7 cm above the carina. NG tube is seen coursing below the diaphragm. Mildly increased interstitial markings are seen throughout both lungs. Probable trace left pleural effusion is seen. No acute osseous abnormality. IMPRESSION: ET tube and NG tube in satisfactory position. Mildly increased interstitial opacities, likely interstitial edema. Trace left  pleural effusion. Electronically Signed   By: Jonna Clark M.D.   On: 09/08/2020 16:02   DG CHEST PORT 1 VIEW  Result Date: 09/08/2020 CLINICAL DATA:  Endotracheal tube placement. EXAM: PORTABLE CHEST 1 VIEW COMPARISON:  09/07/2020 FINDINGS: The endotracheal tube has tip 5.2 cm above the carina. Enteric tube courses into the stomach and off the film as tip is not visualized. Lungs are adequately inflated with persistent hazy bilateral perihilar opacification likely interstitial edema and less likely infection. No effusion. Mild stable cardiomegaly. Remainder of the exam is unchanged. IMPRESSION: 1. Persistent hazy bilateral perihilar opacification likely interstitial edema and less likely infection. 2. Tubes and lines as described. Electronically Signed   By: Elberta Fortis M.D.   On: 09/08/2020 14:45   DG CHEST PORT 1 VIEW  Result Date: 09/07/2020 CLINICAL DATA:  Respiratory distress. EXAM: PORTABLE CHEST 1 VIEW COMPARISON:  09-26-2020 FINDINGS: ET tube tip is above the carina. Stable cardiomediastinal contours. Diffuse pulmonary vascular congestion. Interval worsening aeration to the left midlung and left base which may represent airspace disease and or pleural effusion. IMPRESSION: Interval worsening aeration to the left midlung and left base which may represent airspace disease and/or pleural effusion. Electronically Signed   By: Signa Kell M.D.   On: 09/07/2020 10:48   DG Abd Portable 1V  Result Date: 09/08/2020 CLINICAL DATA:  Enteric tube placement. EXAM: PORTABLE ABDOMEN - 1 VIEW COMPARISON:  Chest x-ray earlier same day. FINDINGS: Interval placement of enteric tube with tip and side-port over the stomach in the left upper quadrant. Visualized bowel gas pattern is nonobstructive. Remainder of the exam is unchanged. IMPRESSION: Enteric tube with tip and side-port over the stomach in the left upper quadrant. Nonobstructive bowel gas pattern. Electronically Signed   By: Elberta Fortis M.D.   On:  09/08/2020 14:46   ECHOCARDIOGRAM COMPLETE  Result Date: 09/07/2020    ECHOCARDIOGRAM REPORT   Patient Name:   Terry Howell. Date of Exam: 09/07/2020 Medical Rec #:  [id number]          Height: Accession #:    [id number]         Weight:  275.6 lb Date of Birth:  [birth year]         BSA:          2.500 m Patient Age:    63 years           BP:           122/62 mmHg Patient Gender: M                  HR:           120 bpm. Exam Location:  Inpatient Procedure: Intracardiac Opacification Agent and 2D Echo Indications:    Stroke 434.91 / I163.9  History:        Patient has no prior history of Echocardiogram examinations.                 CHF, Stroke; Risk Factors:Hypertension and Diabetes. Acute                 ischemic left MCA stroke , Acute Respiratory Failure.  Sonographer:    Jeryl Columbia Referring Phys: [id number] SUSHANTH R AROOR IMPRESSIONS  1. Left ventricular ejection fraction, by estimation, is 30 to 35%. The left ventricle has moderately decreased function. The left ventricle demonstrates regional wall motion abnormalities (see scoring diagram/findings for description). The left ventricular internal cavity size was mildly dilated. There is moderate left ventricular hypertrophy. Left ventricular diastolic parameters are indeterminate.  2. Right ventricular systolic function is normal. The right ventricular size is normal.  3. The mitral valve is degenerative. Mild mitral valve regurgitation. No evidence of mitral stenosis. Moderate mitral annular calcification.  4. The aortic valve is tricuspid. Aortic valve regurgitation is not visualized. Mild to moderate aortic valve sclerosis/calcification is present, without any evidence of aortic stenosis.  5. The inferior vena cava is dilated in size with >50% respiratory variability, suggesting right atrial pressure of 8 mmHg. Conclusion(s)/Recommendation(s): No intracardiac source of embolism detected on this transthoracic study. A transesophageal  echocardiogram is recommended to exclude cardiac source of embolism if clinically indicated. FINDINGS  Left Ventricle: Left ventricular ejection fraction, by estimation, is 30 to 35%. The left ventricle has moderately decreased function. The left ventricle demonstrates regional wall motion abnormalities. The left ventricular internal cavity size was mildly dilated. There is moderate left ventricular hypertrophy. Left ventricular diastolic parameters are indeterminate.  LV Wall Scoring: The mid and distal anterior septum, entire apex, and mid and distal inferior wall are akinetic. Right Ventricle: The right ventricular size is normal. No increase in right ventricular wall thickness. Right ventricular systolic function is normal. Left Atrium: Left atrial size was normal in size. Right Atrium: Right atrial size was normal in size. Pericardium: There is no evidence of pericardial effusion. Mitral Valve: The mitral valve is degenerative in appearance. Moderate mitral annular calcification. Mild mitral valve regurgitation. No evidence of mitral valve stenosis. Tricuspid Valve: The tricuspid valve is normal in structure. Tricuspid valve regurgitation is not demonstrated. No evidence of tricuspid stenosis. Aortic Valve: The aortic valve is tricuspid. Aortic valve regurgitation is not visualized. Mild to moderate aortic valve sclerosis/calcification is present, without any evidence of aortic stenosis. Pulmonic Valve: The pulmonic valve was normal in structure. Pulmonic valve regurgitation is not visualized. No evidence of pulmonic stenosis. Aorta: The aortic root is normal in size and structure. Venous: The inferior vena cava is dilated in size with greater than 50% respiratory variability, suggesting right atrial pressure of 8 mmHg. IAS/Shunts: No atrial level shunt detected by color flow Doppler.  LEFT VENTRICLE  PLAX 2D LVIDd:         5.70 cm  Diastology LVIDs:         4.20 cm  LV e' medial:    15.00 cm/s LV PW:          1.40 cm  LV E/e' medial:  7.5 LV IVS:        1.40 cm  LV e' lateral:   14.10 cm/s LVOT diam:     2.00 cm  LV E/e' lateral: 7.9 LVOT Area:     3.14 cm  RIGHT VENTRICLE RV S prime:     21.90 cm/s TAPSE (M-mode): 2.4 cm LEFT ATRIUM             Index       RIGHT ATRIUM           Index LA diam:        4.00 cm 1.60 cm/m  RA Area:     16.20 cm LA Vol (A2C):   68.8 ml 27.52 ml/m RA Volume:   44.70 ml  17.88 ml/m LA Vol (A4C):   95.9 ml 38.35 ml/m LA Biplane Vol: 85.2 ml 34.07 ml/m   AORTA Ao Root diam: 3.80 cm MITRAL VALVE MV Area (PHT): 5.97 cm     SHUNTS MV Decel Time: 127 msec     Systemic Diam: 2.00 cm MR Peak grad: 47.9 mmHg MR Vmax:      346.00 cm/s MV E velocity: 112.00 cm/s Donato Schultz MD Electronically signed by Donato Schultz MD Signature Date/Time: 09/07/2020/1:40:29 PM    Final    IR PERCUTANEOUS ART THROMBECTOMY/INFUSION INTRACRANIAL INC DIAG ANGIO  Result Date: 09/07/2020 INDICATION: 63-year-ol male presents for mechanical thrombectomy of left emergent large vessel occlusion involving the left M1 EXAM: ULTRASOUND-GUIDED ACCESS RIGHT COMMON FEMORAL ARTERY CERVICAL AND CEREBRAL ANGIOGRAM MECHANICAL THROMBECTOMY LEFT MCA ANGIO-SEAL FOR HEMOSTASIS COMPARISON:  CT imaging same day MEDICATIONS: None ANESTHESIA/SEDATION: The anesthesia team was present to provide general endotracheal tube anesthesia and for patient monitoring during the procedure. Intubation was performed in negative pressure Bay in neuro IR holding. Left radial arterial line was performed by the anesthesia team. Interventional neuro radiology nursing staff was also present. CONTRAST:  60 cc contrast FLUOROSCOPY TIME:  Fluoroscopy Time: 8 minutes 48 seconds (778 mGy). COMPLICATIONS: None TECHNIQUE: Informed written consent was obtained from the patient's family after a thorough discussion of the procedural risks, benefits and alternatives. Specific risks discussed include: Bleeding, infection, contrast reaction, kidney injury/failure, need  for further procedure/surgery, arterial injury or dissection, embolization to new territory, intracranial hemorrhage (10-15% risk), neurologic deterioration, cardiopulmonary collapse, death. All questions were addressed. Maximal Sterile Barrier Technique was utilized including during the procedure including caps, mask, sterile gowns, sterile gloves, sterile drape, hand hygiene and skin antiseptic. A timeout was performed prior to the initiation of the procedure. The anesthesia team was present to provide general endotracheal tube anesthesia and for patient monitoring during the procedure. Interventional neuro radiology nursing staff was also present. FINDINGS: Initial Findings: Left common carotid artery:  Normal course caliber and contour. Left external carotid artery: Patent with antegrade flow. Left internal carotid artery: Normal course caliber and contour of the cervical portion. Vertical and petrous segment patent with normal course caliber contour. Cavernous segment patent. Clinoid segment patent. Antegrade flow of the ophthalmic artery. Ophthalmic segment patent. Terminus patent. Left MCA: No significant atherosclerotic changes are narrowing of the left MCA. There is a fairly early division/origin of the MCA, with an upgoing branch just beyond the  lenticulostriate arteries, superior division. There is a orbital frontal or frontal polar branch in a downgoing configuration. The dominant branch is the inferior division, which is apparently the dominant division given its supply to the parietal territory after reperfusion. Of note, there is an M4 occlusion of the early superior branch, which remains occluded throughout. Left ACA: A 1 segment patent. A 2 segment perfuses the right territory. Significant p.o. collaterals are identified, attempting to reperfuse the parietal territory and the temporal territory from the ACA distribution in the watershed area. Completion Findings: Left MCA: Complete reperfusion of  the dominant inferior division of the M1 with no embolization new territory. Flat panel CT: No hemorrhage. There is stagnant focus of contrast associated with the M4 occlusion, within a separate arterial territory than the treated M1 segment. PROCEDURE: The anesthesia team was present to provide general endotracheal tube anesthesia and for patient monitoring during the procedure. Intubation was performed in negative pressure Bay in neuro IR holding. Interventional neuro radiology nursing staff was also present. Ultrasound survey of the right inguinal region was performed with images stored and sent to PACs. 11 blade scalpel was used to make a small incision. Blunt dissection was performed with US guidance. A micropuncture needle was used access the right common femoral artery under ultrasound. With excellent arterial blood flow returned, an .018 micro wire was passed through the needle, observed to enter the abdominal aorta under fluoroscopy. The needle was removed, and a micropuncture sheath was placed over the wire. The inner dilator and wire were removed, and an 035 wire was advanced under fluoroscopy into the abdominal aorta. The sheath was removed and a 25cm 41F straight vascular sheath was placed. The dilator was removed and the sheath was flushed. Sheath was attached to pressurized and heparinized saline bag for constant forward flow. A coaxial system was then advanced over the 035 wire. This included a 95cm 087 "Walrus" balloon guide with coaxial 125cm Berenstein diagnostic catheter. This was advanced to the proximal descending thoracic aorta. Wire was then removed. Double flush of the catheter was performed. Catheter was then used to select the left common carotid artery. Angiogram was performed. Using roadmap technique, the catheter was advanced over a standard glide wire into left cervical ICA, with distal position achieved of the balloon guide. The diagnostic catheter and the wire were removed. Formal  angiogram was performed. Road map function was used once the occluded vessel was identified. Copious back flush was performed and the balloon catheter was attached to heparinized and pressurized saline bag for forward flow. A second coaxial system was then advanced through the balloon catheter, which included the selected intermediate catheter, microcatheter, and microwire. In this scenario, the set up included a 137cm zoom 71 intermediate catheter, a Trevo Pro18 microcatheter, and 014 synchro soft wire. This system was advanced through the balloon guide catheter under the road-map function, with adequate back-flush at the rotating hemostatic valve at that back end of the balloon guide. Microcatheter and the intermediate catheter system were advanced through the terminal ICA and MCA to the level of the occlusion. The micro wire was then carefully advanced up to the occluded segment. Intermediate catheter was then advanced on the microcatheter and wire combination, up to the proximal face of the thrombus. The microcatheter and microwire were then gently removed and the rotating hemostatic valve was removed from the intermediate catheter. Direct aspiration was then performed through the zoom catheter. Zoom catheter was then gently withdrawn with full aspiration, while manipulating the balloon  guide more proximally within the cervical artery. Zoom catheter was slowly withdrawn from the balloon guide. Angiogram was performed, confirming complete restoration of flow. Angiogram of the cervical ICA was performed. Balloon guide was then removed. The skin at the puncture site was then cleaned with Chlorhexidine. The 8 French sheath was removed and an 64F angioseal was deployed. Flat panel CT was performed. Patient was extubated once the CT was reviewed. Patient tolerated the procedure well and remained hemodynamically stable throughout. No complications were encountered and no significant blood loss encountered. IMPRESSION:  Status post ultrasound guided access right common femoral artery for left-sided cervical/cerebral angiogram and mechanical thrombectomy of left distal M1 occlusion using direct aspiration technique and achieving complete reperfusion of the occluded vessel. Angio-Seal for hemostasis. PLAN: The patient will remain intubated, given his comorbidities ICU status Target systolic blood pressure of 120-140 Right hip straight time 6 hours Frequent neurovascular checks Repeat neurologic imaging with CT and/MRI at the discretion of neurology team Electronically Signed   By: Gilmer MorJaime  Wagner D.O.   On: 09/07/2020 10:18      HISTORY OF PRESENT ILLNESS Terry Chrisrvin C Standish Jr. is a 64 y.o. male with past medical history significant for hypertension, hyperlipidemia, uncontrolled insulin-dependent diabetes mellitus, history of coronary artery disease status post PCI, heart failure presents to the emergency department [facility] with sudden onset aphasia and right-sided weakness that began 08/28/2020 at 4 PM. Last seen normal was 4 PM-patient was out to take the trash and suddenly developed right-sided weakness and aphasia.  Patient was taken to [facility] ED as a code stroke.  Tele-neurologist was consulted and upon assessment NIH stroke scale was 24.  Stat CT head was performed which showed no hemorrhage and aspects was 10/10.  Patient received IV TPA with delay due to wife's initial reluctance.  CT angiogram showed a left M1 occlusion.  Received a call from Dr. Thomasena Edisollins, ED PA at [facility] around 8:15 PM.  Spoke to her over the phone and patient appeared to be a candidate for mechanical thrombectomy and requests immediate transfer to [facility] for emergent mechanical thrombectomy. Discussed with patient's wife over the phone who consented for thrombectomy procedure. [facility] arrived around 9:30 PM to [facility]-was immediately taken for neuro intervention.  NIH stroke scale remained stable at 24  prior to thrombectomy.  Baseline MRS 0. IV TPA was completed at 9:06 PM.    HOSPITAL COURSE Mr. Terry Chrisrvin C Mollenkopf Jr. is a 64 y.o. male with history of hypertension, hyperlipidemia,uncontrolled insulin-dependent diabetes mellitus, history of coronary artery disease status post PCI, heart failure presented [facility] the ED [facility] with sudden onset aphasia and right-sided weakness. Tele-neurology consult -> [facility]. [facility] for IR intervention. 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 Occult afib  CT Head - no acute finding     CTA H&N - OSH - Lt M1 occlusion   IR left M1 occlusion with TICI3 reperfusion  MRI head - Left lenticulostriate territory infarcts with patchy petechial hemorrhage and mildly expanded left basal ganglia. Elsewhere there are a few scattered punctate and occasionally patchy infarcts in the Left MCA territory.  MRA head - Left MCA patent  2D Echo EF 30-35%  Cardiology recommended outpt cardiac monitoring to rule out afib  EEG no seizure  [employer] Virus 2 - neg OSH  LDL - 50  HgbA1c - 8.7  UDS - neg  No antithrombotic prior to admission, treated w/ ASA 325 via OG in hospital  Therapy recommendations:  CIR  Patient with progressive respiratory decompensation following extubation. Wife concerned about worsening condition - requested comfort care as pt was DNR PTA - consulted palliative for goals of care and symptom management - started morphine prn and reached out to wife via telephone as she is w/c bound at home. Wife is agreeable to and desires comfort care  Patient transitioned to comfort care. He died shortly following.   Respiratory failure d/t pulmonary edema  Intubated for procedure  Continue to be intubated postprocedure  Failed the  weaning trials x 2 -> then improved on weaning  On diuresis  Extubated 9/21  Increase WOB, increased RR 09/19/2023 following extubationg  CCM on board and discussed goals of care, reinubation  Cardiomyopathy Acute Systolic CHF Hx of CAD s/p stent 17 years ago  Has not followed with cardiology  TTE EF 30-35%  Could be related to current stroke  On lasix 40 Q12h  CXR 9/19 mild edema   CXR 9/20 mild R>L airspace opacity, ? R effusion  Cardiology onboard - recommended outpt cardiac monitoring to rule out afib  SVT  On amio gtt  Hx of hypertension Hypotension  Home BP meds: none   Treated w/ Neo post IR  On diuresis per CCM  In hospital received coreg until no PO access, losartan on hold d/t borderline BP  Hyperlipidemia  Home Lipid lowering medication: none   LDL 50, goal < 70  On Pravachol 20  Diabetes type II  Home diabetic meds: none   HgbA1c 8.7, goal < 7.0  SSI 0-20  CBG monitoring  Hyperglycemia 200-300s  Treated w/ insulin gtt, now off  Put on lantus 30->40 bid   Leukocytosis and fever  WBC 19.4->20.6->17.6->18.7  Tmax 101.1  UA neg  Resp Cx 9/19 reincubated  CXR 9/19 mild edema   CXR 9/20 mild R>L airspace opacity, ? R effusion  BCx 9/20 G+ cocci   On cefepime 19-Sep-2023  Dysphagia   Had OG tube when intubated  On diuresis  On tube feeding during intubation  TF stopped w/ extubation and removal of OG  Other Stroke Risk Factors  Advanced age  Coronary artery disease  Congestive Heart Failure  Other Active Problems  Code status - DNR  Acute blood loss anemia - Hgb - 10.5->10.4->10.3->10.3  Decreased pedal pulses, palpable but weak  On Desyrel 100 hs  Hypokalemia K 3.4 - supplemented  DISCHARGE PLAN  Death:  September 18, 2020 at 1301  28 minutes were spent preparing discharge.  Annie Main, MSN, APRN, ANVP-BC, AGPCNP-BC Advanced Practice Stroke Nurse [facility] See Amion for Schedule &  Pager information 18-Sep-2020 4:59 PM

## 2020-09-20 NOTE — Progress Notes (Signed)
Progress Note  Patient Name: Terry Howell. Date of Encounter: 09/08/2020  Primary Cardiologist: New, lives in [place], f/u [place]  Subjective   Minimally responsive, coarse breath sounds on NRB, opens eyes briefly then falls asleep quickly. Per nursing notes and discussion with nursing, patient is working towards comfort care approach.  Inpatient Medications    Scheduled Meds: .  stroke: mapping our early stages of recovery book   Does not apply Once  . aspirin  325 mg Per Tube Daily  . carvedilol  6.25 mg Per Tube BID WC  . chlorhexidine  15 mL Mouth Rinse BID  . Chlorhexidine Gluconate Cloth  6 each Topical Daily  . feeding supplement (PROSource TF)  90 mL Per Tube TID  . furosemide  40 mg Intravenous BID  . influenza vac split quadrivalent PF  0.5 mL Intramuscular Tomorrow-1000  . insulin aspart  0-20 Units Subcutaneous Q4H  . insulin glargine  40 Units Subcutaneous BID  . mouth rinse  15 mL Mouth Rinse q12n4p  . pantoprazole (PROTONIX) IV  40 mg Intravenous QHS  . pneumococcal 23 valent vaccine  0.5 mL Intramuscular Tomorrow-1000  . pravastatin  20 mg Oral q1800   Continuous Infusions: . amiodarone 30 mg/hr (09/02/2020 0600)  . ceFEPime (MAXIPIME) IV 200 mL/hr at 09/06/2020 0600  . feeding supplement (VITAL HIGH PROTEIN) Stopped (09/10/20 1000)  . insulin 6 Units/hr (09/07/2020 0649)   PRN Meds: acetaminophen **OR** acetaminophen (TYLENOL) oral liquid 160 mg/5 mL **OR** acetaminophen, dextrose, fentaNYL (SUBLIMAZE) injection, senna-docusate   Vital Signs    Vitals:   08/30/2020 0500 08/24/2020 0600 09/03/2020 0700 08/25/2020 0800  BP: 128/82 130/81 129/84 133/83  Pulse: (!) 117 (!) 114 (!) 115 (!) 115  Resp: (!) 40 (!) 39 (!) 40 (!) 40  Temp:      TempSrc:      SpO2: 96% 97% 97% 95%  Weight:        Intake/Output Summary (Last 24 hours) at 09/07/2020 0902 Last data filed at 08/29/2020 0800 Gross per 24 hour  Intake 1179.8 ml  Output 1200 ml  Net -20.2 ml    Last 3 Weights 09/10/2020 09/09/2020 2020-09-18  Weight (lbs) 273 lb 13 oz 274 lb 4 oz 275 lb 9.2 oz  Weight (kg) 124.2 kg 124.4 kg 125 kg     Telemetry    Sinus tach with frequent PACs, occasional brief atrial runs - Personally Reviewed  Physical Exam   GEN: Ill appearing obese WM HEENT: Normocephalic, atraumatic, sclera non-icteric. Neck: No JVD or bruits. Cardiac: Tachycardic, regular, no murmurs, rubs, or gallops.  Radials/DP/PT 1+ and equal bilaterally.  Respiratory: Diffuse coarse BS bilaterally with increased secretions on NRB Abd: Soft, nontender, non-distended, BS +x 4. MS: no deformity. Extremities: No clubbing or cyanosis. No edema. Distal pedal pulses are 2+ and equal bilaterally. Neuro:  Obtunded Psych: Unable to assess  Labs    High Sensitivity Troponin:  No results for input(s): TROPONINIHS in the last 720 hours.    Cardiac EnzymesNo results for input(s): TROPONINI in the last 168 hours. No results for input(s): TROPIPOC in the last 168 hours.   Chemistry Recent Labs  Lab 09/09/20 0357 09/09/20 1710 09/10/20 0658  NA 142 145 148*  K 3.8 3.5 3.4*  CL 109 109 110  CO2 22 23 25   GLUCOSE 378* 438* 195*  BUN 39* 42* 45*  CREATININE 1.14 1.12 0.98  CALCIUM 8.2* 8.4* 8.3*  GFRNONAA >60 >60 >60  GFRAA >60 >60 >  60  ANIONGAP 11 13 13      Hematology Recent Labs  Lab 09/08/20 0706 09/09/20 0357 09/10/20 0658  WBC 20.6* 17.6* 18.7*  RBC 3.17* 3.20* 3.23*  HGB 10.4* 10.3* 10.3*  HCT 32.9* 32.9* 33.2*  MCV 103.8* 102.8* 102.8*  MCH 32.8 32.2 31.9  MCHC 31.6 31.3 31.0  RDW 14.6 14.6 14.5  PLT 185 186 185    BNP Recent Labs  Lab 09/08/20 0706  BNP 611.1*     DDimer No results for input(s): DDIMER in the last 168 hours.   Radiology    DG Chest 1 View  Result Date: 09/10/2020 CLINICAL DATA:  Hypoxemia EXAM: CHEST  1 VIEW COMPARISON:  Film from the previous day. FINDINGS: Endotracheal tube and gastric catheter have been removed in the  interval. Cardiac shadow remains enlarged. Increasing vascular congestion and interstitial edema is seen. Enlarging right-sided pleural effusion is noted as well. Degenerative change of the thoracic spine is seen. IMPRESSION: Changes consistent with increasing CHF and right-sided pleural effusion. Electronically Signed   By: 09/12/2020 M.D.   On: 09/10/2020 19:30   DG CHEST PORT 1 VIEW  Result Date: 09/09/2020 CLINICAL DATA:  Tachycardia EXAM: PORTABLE CHEST 1 VIEW COMPARISON:  09/08/2020 FINDINGS: Endotracheal tube and NG tube remain in place, unchanged. Cardiomegaly. Bilateral lower lobe airspace opacities, slightly worsened on the right since prior study. Possible small layering right effusion. No acute bony abnormality. IMPRESSION: Bibasilar airspace opacities, worsening on the right. Question small right effusion. Electronically Signed   By: 09/10/2020 M.D.   On: 09/09/2020 10:54    Cardiac Studies   ECHO:  09/07/2020 1. Left ventricular ejection fraction, by estimation, is 30 to 35%. The  left ventricle has moderately decreased function. The left ventricle  demonstrates regional wall motion abnormalities (see scoring  diagram/findings for description). The left  ventricular internal cavity size was mildly dilated. There is moderate  left ventricular hypertrophy. Left ventricular diastolic parameters are  indeterminate.  2. Right ventricular systolic function is normal. The right ventricular  size is normal.  3. The mitral valve is degenerative. Mild mitral valve regurgitation. No  evidence of mitral stenosis. Moderate mitral annular calcification.  4. The aortic valve is tricuspid. Aortic valve regurgitation is not  visualized. Mild to moderate aortic valve sclerosis/calcification is  present, without any evidence of aortic stenosis.  5. The inferior vena cava is dilated in size with >50% respiratory  variability, suggesting right atrial pressure of 8 mmHg.    Conclusion(s)/Recommendation(s): No intracardiac source of embolism  detected on this transthoracic study. A transesophageal echocardiogram is  recommended to exclude cardiac source of embolism if clinically indicated.  Patient Profile     64 y.o. male w/ hx CAD with history of "widowmaker" MI 2004 s/p stent at [place], DM, HTN, ? Prior chronic systolic CHF (wife was told heart only working 40%). He initially presented to [facility] w/ with sudden aphasia and right sided weakness, found to have CVA. He was transferred to [facility] 9/17 for thrombectomy and TPA. Hospital course notable for acute on chronic systolic CHF with LVEF 30-35%, hyperglycemia, macrocytic anemia and acute respiratory failure. Extubated on 9/21 but still requring high level of supplemental O2 due to hypoxia. Wife unable to drive to the hospital due to her own medical issues, discussions had earlier this admission about goals of care, palliative medicine consulted.  Assessment & Plan    1. Acute L MCA infarct s/p thrombectomy/TPA - aspirin held this AM per nursing  notes, consider transition to rectal if it is deemed he cannot take orals via tube although per update he is likely transitioning towards comfort care  2. Acute on chronic systolic CHF/cardiomyopathy - MAR includes carvedilol starting yesterday but has not yet received - as above, held by nursing as patient is being kept NPO so consider transition to IV metoprolol if needed - no ACEI/ARB given transition towards comfort measures - remains on IV Lasix, labs pending (hypernatremia/hypokalemia yesterday) - will review diuretic plan with MD  3. Acute respiratory failure with continued hypoxia, fever, leukocytosis - neuro/critical care following  4. CAD s/p remote PCI - no acute interventions planned, secondary risk factor management if appropriate  5. HLD - statin titration not indicated if heading towards comfort measures  For questions or updates, please  contact [facility] Please consult [email or web address] for contact info under Cardiology/STEMI.  Signed, Laurann Montana, PA-C 08/21/2020, 9:02 AM

## 2020-09-20 NOTE — Consult Note (Addendum)
Consultation Note Date: 09/26/2020   Patient Name: Terry Howell.  DOB: Feb 15, 1956  MRN: [id number]  Age / Sex: 64 y.o., male  PCP: Marcy Panning, MD Referring Physician: Marvel Plan, MD  Reason for Consultation: Establishing goals of care and Terminal Care  HPI/Patient Profile: 64 y.o. male  with past medical history of HTN, DM, HLD admitted on 08/29/2020 with acute MCA CVA. Has been minimally responsive, extubated 9/21 but respiratory status is continuing to decompensate. Has CHF with eF 30-35%.  Palliative medicine consulted for assistance with goals of care and symptom management for comfort.    Clinical Assessment and Goals of Care: I have reviewed patient's chart and received report from patient's RN.  Patient is very clearly DNR and no intubation.  Per chart review - goals of care have been discussed and documented by Dr. Judeth Horn with spouse that if patient were not to improve he would not want continued aggressive care and would want comfort measures.  Per RN patient is in worse state today- not following commands, breathing is labored, he is diaphoretic. I have attempted to reach spouse but no answer.  Due to previously documented DNR and goals of care I am comfortable ordering PRN morphine at this point. Will continue to try and reach spouse to discuss full transition to comfort- if he declines even more significantly and is obviously struggling- recommend immediate transition to more aggressive comfort with morphine infusion and versed prn.    Primary Decision Maker NEXT OF KIN- patient's spouse    SUMMARY OF RECOMMENDATIONS -Due to previously documented DNR and goals of care I am comfortable ordering PRN morphine at this point.  -Will continue to try and reach spouse to discuss full transition to comfort- if he declines even more significantly and is obviously struggling- recommend  immediate transition to more aggressive comfort with morphine infusion and versed prn and discontinuation of other life prolonging measures.     Code Status/Advance Care Planning:  DNR  Palliative Prophylaxis:   Frequent Pain Assessment  Prognosis:    Hours - Days  Discharge Planning: Anticipated Hospital Death  Primary Diagnoses: Present on Admission: . Acute ischemic left MCA stroke (HCC) . Acute systolic CHF (congestive heart failure) (HCC)   I have reviewed the medical record, interviewed the patient and family, and examined the patient. The following aspects are pertinent.  Past Medical History:  Diagnosis Date  . Acute systolic CHF (congestive heart failure) (HCC) 09/10/2020  . DM (diabetes mellitus) (HCC)   . Myocardial infarction [facility]) 2004   "widowmaker" s/p stent ?LAD, in [place]    Continuous Infusions: . amiodarone 30 mg/hr (September 26, 2020 0600)  . ceFEPime (MAXIPIME) IV 200 mL/hr at 09/26/20 0600  . dextrose 40 mL/hr at 2020/09/26 1006  . feeding supplement (VITAL HIGH PROTEIN) Stopped (09/10/20 1000)  . insulin Stopped (09-26-2020 1004)   PRN Meds:.acetaminophen **OR** acetaminophen (TYLENOL) oral liquid 160 mg/5 mL **OR** acetaminophen, dextrose, fentaNYL (SUBLIMAZE) injection, morphine injection, senna-docusate  No Known Allergies   Vital Signs: BP 125/84  Pulse (!) 41   Temp (!) 101.8 F (38.8 C) (Axillary) Comment: Nurse notified  Resp (!) 38   Wt 124.2 kg   SpO2 94%  Pain Scale: CPOT   SpO2: SpO2: 94 % O2 Device:SpO2: 94 % O2 Flow Rate: .O2 Flow Rate (L/min): 15 L/min  IO: Intake/output summary:   Intake/Output Summary (Last 24 hours) at 2020-10-10 1107 Last data filed at October 10, 2020 0800 Gross per 24 hour  Intake 1110.74 ml  Output 1200 ml  Net -89.26 ml    LBM: Last BM Date: 09/10/20 Baseline Weight: Weight: 125 kg (estimate) Most recent weight: Weight: 124.2 kg     Palliative Assessment/Data: 10%    The above conversation was completed via  telephone due to the restrictions during the COVID-19 pandemic. Thorough chart review and discussion with necessary members of the care team was completed as part of assessment. All issues were discussed and addressed but no physical exam was performed.   Thank you for this consult. Palliative medicine will continue to follow and assist as needed.   Time Total: 74 minutes Greater than 50%  of this time was spent counseling and coordinating care related to the above assessment and plan.  Signed by: Ocie Bob, AGNP-C Palliative Medicine    Please contact Palliative Medicine Team phone at [phone] for questions and concerns.  For individual provider: See Loretha Stapler

## 2020-09-20 NOTE — Progress Notes (Signed)
Able to update wife. She would like to transition to comfort per his previously expressed wishes. She will unfortunately not be able to come to hospital as she is wheel chair bound. She does not want to delay his comfort to attempt further the find rides.    RN updated.  We will place orders for comfort.

## 2020-09-20 NOTE — Progress Notes (Signed)
Insulin drip stopped per Dr. Gaynell Face. 10 units of Lantus given. D10 started at 40 mL/hr.

## 2020-09-20 NOTE — Progress Notes (Signed)
SLP Cancellation Note  Patient Details Name: Terry Howell. MRN: [id number] DOB: 06/07/1956   Cancelled treatment:       Reason Eval/Treat Not Completed: Medical issues which prohibited therapy. Per PT, who discussed case with RN, pt transitioning to comfort care. Will therefore defer cognitive evaluation at this time. Please reorder SLP if needed.     Mahala Menghini., M.A. CCC-SLP Acute Rehabilitation Services Pager [phone] Office [phone]  Sep 16, 2020, 10:26 AM

## 2020-09-20 DEATH — deceased

## 2022-01-13 IMAGING — DX DG CHEST 1V PORT
1 series · 1 of 1 positions shown · non-contrast
Comparison: 09/08/2020

CLINICAL DATA: Tachycardia

EXAM:
PORTABLE CHEST 1 VIEW

[chest ap]
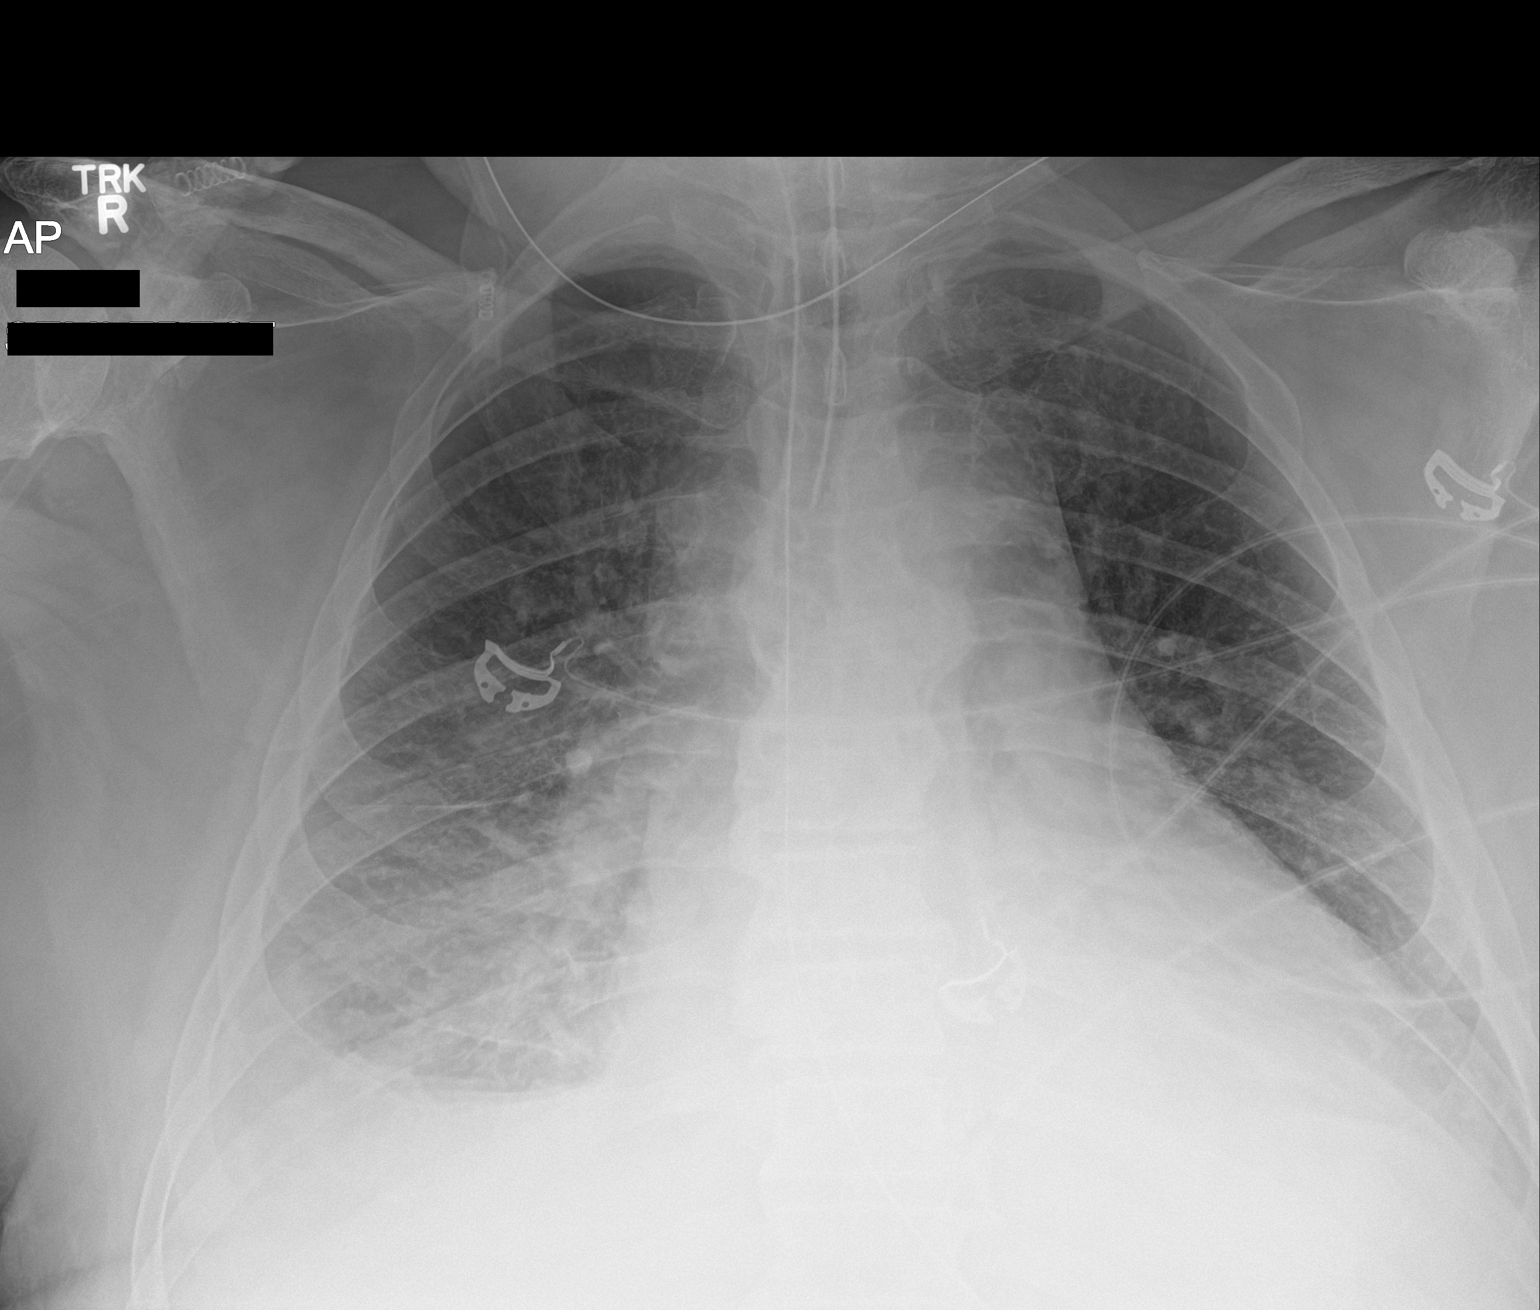

[1 of 1 positions shown; findings below may reference images not displayed]

FINDINGS: Endotracheal tube and NG tube remain in place, unchanged.
Cardiomegaly. Bilateral lower lobe airspace opacities, slightly
worsened on the right since prior study. Possible small layering
right effusion. No acute bony abnormality.
IMPRESSION: Bibasilar airspace opacities, worsening on the right. Question small
right effusion.
